# Patient Record
Sex: Female | Born: 1980 | Race: White | Hispanic: Yes | Marital: Married | Smoking: Never smoker
Health system: Southern US, Community
[De-identification: ages and names within clinical notes are randomized; demographics above are authoritative.]

## PROBLEM LIST (undated history)

## (undated) DIAGNOSIS — I10 Essential (primary) hypertension: Secondary | ICD-10-CM

---

## 2001-10-16 ENCOUNTER — Ambulatory Visit (HOSPITAL_COMMUNITY): Admission: RE | Admit: 2001-10-16 | Discharge: 2001-10-16 | Payer: Self-pay | Admitting: *Deleted

## 2001-12-26 ENCOUNTER — Encounter: Payer: Self-pay | Admitting: Obstetrics & Gynecology

## 2001-12-26 ENCOUNTER — Ambulatory Visit (HOSPITAL_COMMUNITY): Admission: RE | Admit: 2001-12-26 | Discharge: 2001-12-26 | Payer: Self-pay | Admitting: Obstetrics & Gynecology

## 2002-01-27 ENCOUNTER — Inpatient Hospital Stay (HOSPITAL_COMMUNITY): Admission: AD | Admit: 2002-01-27 | Discharge: 2002-01-29 | Payer: Self-pay | Admitting: *Deleted

## 2003-02-25 ENCOUNTER — Emergency Department (HOSPITAL_COMMUNITY): Admission: EM | Admit: 2003-02-25 | Discharge: 2003-02-25 | Payer: Self-pay | Admitting: Emergency Medicine

## 2003-02-25 ENCOUNTER — Encounter: Payer: Self-pay | Admitting: Emergency Medicine

## 2003-07-04 ENCOUNTER — Inpatient Hospital Stay (HOSPITAL_COMMUNITY): Admission: AD | Admit: 2003-07-04 | Discharge: 2003-07-04 | Payer: Self-pay | Admitting: *Deleted

## 2003-07-04 ENCOUNTER — Encounter: Payer: Self-pay | Admitting: *Deleted

## 2003-10-21 ENCOUNTER — Inpatient Hospital Stay (HOSPITAL_COMMUNITY): Admission: AD | Admit: 2003-10-21 | Discharge: 2003-10-21 | Payer: Self-pay | Admitting: *Deleted

## 2003-11-29 ENCOUNTER — Inpatient Hospital Stay (HOSPITAL_COMMUNITY): Admission: AD | Admit: 2003-11-29 | Discharge: 2003-12-01 | Payer: Self-pay | Admitting: Obstetrics and Gynecology

## 2004-08-20 ENCOUNTER — Emergency Department (HOSPITAL_COMMUNITY): Admission: EM | Admit: 2004-08-20 | Discharge: 2004-08-20 | Payer: Self-pay | Admitting: Family Medicine

## 2005-03-18 ENCOUNTER — Emergency Department (HOSPITAL_COMMUNITY): Admission: EM | Admit: 2005-03-18 | Discharge: 2005-03-18 | Payer: Self-pay | Admitting: Family Medicine

## 2005-10-26 ENCOUNTER — Emergency Department (HOSPITAL_COMMUNITY): Admission: EM | Admit: 2005-10-26 | Discharge: 2005-10-26 | Payer: Self-pay | Admitting: Emergency Medicine

## 2005-12-13 ENCOUNTER — Emergency Department (HOSPITAL_COMMUNITY): Admission: EM | Admit: 2005-12-13 | Discharge: 2005-12-13 | Payer: Self-pay | Admitting: Emergency Medicine

## 2007-08-06 ENCOUNTER — Inpatient Hospital Stay (HOSPITAL_COMMUNITY): Admission: AD | Admit: 2007-08-06 | Discharge: 2007-08-08 | Payer: Self-pay | Admitting: Family Medicine

## 2007-08-06 ENCOUNTER — Ambulatory Visit: Payer: Self-pay | Admitting: *Deleted

## 2008-07-07 ENCOUNTER — Ambulatory Visit: Payer: Self-pay | Admitting: *Deleted

## 2008-07-07 ENCOUNTER — Ambulatory Visit: Payer: Self-pay | Admitting: Family Medicine

## 2009-10-22 ENCOUNTER — Emergency Department (HOSPITAL_COMMUNITY): Admission: EM | Admit: 2009-10-22 | Discharge: 2009-10-22 | Payer: Self-pay | Admitting: Emergency Medicine

## 2011-06-16 ENCOUNTER — Ambulatory Visit (INDEPENDENT_AMBULATORY_CARE_PROVIDER_SITE_OTHER): Payer: Self-pay

## 2011-06-16 ENCOUNTER — Inpatient Hospital Stay (INDEPENDENT_AMBULATORY_CARE_PROVIDER_SITE_OTHER)
Admission: RE | Admit: 2011-06-16 | Discharge: 2011-06-16 | Disposition: A | Discharge status: Home / Self Care | Payer: Self-pay | Source: Ambulatory Visit | Attending: Family Medicine | Admitting: Family Medicine

## 2011-06-16 DIAGNOSIS — S92909A Unspecified fracture of unspecified foot, initial encounter for closed fracture: Secondary | ICD-10-CM

## 2011-09-16 LAB — DIFFERENTIAL
Lymphs Abs: 2.2
Monocytes Relative: 5
Neutro Abs: 6.7
Neutrophils Relative %: 70

## 2011-09-16 LAB — HIV ANTIBODY (ROUTINE TESTING W REFLEX): HIV: NONREACTIVE

## 2011-09-16 LAB — CBC
MCHC: 34
RBC: 4.6
RDW: 15.2 — ABNORMAL HIGH

## 2011-09-16 LAB — SICKLE CELL SCREEN: Sickle Cell Screen: NEGATIVE

## 2011-09-16 LAB — RAPID HIV SCREEN (WH-MAU): Rapid HIV Screen: NONREACTIVE

## 2011-09-16 LAB — RUBELLA SCREEN: Rubella: >500 — ABNORMAL HIGH

## 2011-09-16 LAB — ABO/RH: ABO/RH(D): O POS

## 2011-09-16 LAB — RPR: RPR Ser Ql: NONREACTIVE

## 2012-01-06 ENCOUNTER — Emergency Department (HOSPITAL_COMMUNITY)
Admission: EM | Admit: 2012-01-06 | Discharge: 2012-01-06 | Disposition: A | Discharge status: Home / Self Care | Payer: Self-pay | Attending: Emergency Medicine | Admitting: Emergency Medicine

## 2012-01-06 ENCOUNTER — Encounter (HOSPITAL_COMMUNITY): Payer: Self-pay | Admitting: Emergency Medicine

## 2012-01-06 DIAGNOSIS — R1310 Dysphagia, unspecified: Secondary | ICD-10-CM | POA: Insufficient documentation

## 2012-01-06 DIAGNOSIS — R51 Headache: Secondary | ICD-10-CM | POA: Insufficient documentation

## 2012-01-06 DIAGNOSIS — R070 Pain in throat: Secondary | ICD-10-CM | POA: Insufficient documentation

## 2012-01-06 DIAGNOSIS — R059 Cough, unspecified: Secondary | ICD-10-CM | POA: Insufficient documentation

## 2012-01-06 DIAGNOSIS — R05 Cough: Secondary | ICD-10-CM | POA: Insufficient documentation

## 2012-01-06 DIAGNOSIS — J3489 Other specified disorders of nose and nasal sinuses: Secondary | ICD-10-CM | POA: Insufficient documentation

## 2012-01-06 DIAGNOSIS — J069 Acute upper respiratory infection, unspecified: Secondary | ICD-10-CM | POA: Insufficient documentation

## 2012-01-06 MED ORDER — IBUPROFEN 600 MG PO TABS: 600.0000 mg | ORAL_TABLET | Freq: Four times a day (QID) | ORAL | Status: AC | PRN

## 2012-01-06 MED ORDER — AZITHROMYCIN 250 MG PO TABS: 250.0000 mg | ORAL_TABLET | Freq: Every day | ORAL | Status: AC

## 2012-01-06 MED ORDER — ACETAMINOPHEN 325 MG PO TABS
650.0000 mg | ORAL_TABLET | Freq: Once | ORAL | Status: AC
  Administered 2012-01-06: 650 mg via ORAL
  Filled 2012-01-06: qty 2

## 2012-01-06 NOTE — ED Notes (Signed)
Vital signs had already been done at 18:01.

## 2012-01-06 NOTE — ED Notes (Signed)
Pt c/o headache, pain in face, eyes watering and sore throat, onset 3 days ago.

## 2012-01-06 NOTE — ED Provider Notes (Signed)
History     CSN: [id number]  Arrival date & time 01/06/12  1632   First MD Initiated Contact with Patient 01/06/12 1727      Chief Complaint  Patient presents with  . Headache    (Consider location/radiation/quality/duration/timing/severity/associated sxs/prior treatment) Patient is a 31 y.o. female presenting with URI. The history is provided by the patient.  URI The primary symptoms include headaches, sore throat and cough. Primary symptoms do not include fever, fatigue, ear pain, swollen glands, wheezing, abdominal pain, nausea, vomiting, myalgias, arthralgias or rash. Primary symptoms comment: watering eyes The current episode started 3 to 5 days ago. This is a new problem. The problem has not changed since onset. The headache is not associated with photophobia, eye pain, neck stiffness or weakness.  The sore throat is not accompanied by trouble swallowing.  Symptoms associated with the illness include facial pain, sinus pressure, congestion and rhinorrhea. The illness is not associated with chills.    History reviewed. No pertinent past medical history.  History reviewed. No pertinent past surgical history.  No family history on file.  History  Substance Use Topics  . Smoking status: Never Smoker   . Smokeless tobacco: Not on file  . Alcohol Use: No    OB History    Grav Para Term Preterm Abortions TAB SAB Ect Mult Living                  Review of Systems  Constitutional: Negative for fever, chills, activity change, appetite change and fatigue.  HENT: Positive for congestion, sore throat, rhinorrhea and sinus pressure. Negative for ear pain, facial swelling, trouble swallowing, neck pain and neck stiffness.   Eyes: Positive for discharge. Negative for photophobia, pain, redness and visual disturbance.  Respiratory: Positive for cough. Negative for chest tightness, shortness of breath and wheezing.   Cardiovascular: Negative for chest pain and palpitations.    Gastrointestinal: Negative for nausea, vomiting and abdominal pain.  Genitourinary: Negative for dysuria.  Musculoskeletal: Negative for myalgias and arthralgias.  Skin: Negative for rash.  Neurological: Positive for headaches. Negative for dizziness and weakness.  Psychiatric/Behavioral: Negative.     Allergies  Review of patient's allergies indicates no known allergies.  Home Medications   Current Outpatient Rx  Name Route Sig Dispense Refill  . PSEUDOEPH-DOXYLAMINE-DM-APAP 60-7.05-03-999 MG/30ML PO LIQD Oral Take 30 mLs by mouth every 6 (six) hours as needed. For cold symptoms      BP 118/85  Pulse 102  Temp(Src) 97.9 F (36.6 C) (Oral)  Resp 18  SpO2 99%  LMP 01/02/2012  Physical Exam  Nursing note and vitals reviewed. Constitutional: She is oriented to person, place, and time. She appears well-developed and well-nourished. No distress.  HENT:  Head: Normocephalic and atraumatic.  Right Ear: External ear normal.  Left Ear: External ear normal.  Nose: Right sinus exhibits maxillary sinus tenderness. Right sinus exhibits no frontal sinus tenderness. Left sinus exhibits maxillary sinus tenderness. Left sinus exhibits no frontal sinus tenderness.  Mouth/Throat: Oropharynx is clear and moist. No oropharyngeal exudate.  Eyes: Conjunctivae are normal. Pupils are equal, round, and reactive to light.  Neck: Normal range of motion. Neck supple.  Cardiovascular: Normal rate, regular rhythm and normal heart sounds.  Exam reveals no gallop and no friction rub.   No murmur heard. Pulmonary/Chest: Effort normal and breath sounds normal. No respiratory distress. She has no wheezes. She has no rales. She exhibits no tenderness.  Abdominal: Soft. Bowel sounds are normal. There is no tenderness.  There is no rebound and no guarding.  Musculoskeletal: Normal range of motion. She exhibits no edema.  Lymphadenopathy:    She has no cervical adenopathy.  Neurological: She is alert and  oriented to person, place, and time. No cranial nerve deficit. Coordination normal.  Skin: Skin is warm and dry. No rash noted. She is not diaphoretic.  Psychiatric: She has a normal mood and affect.    ED Course  Procedures (including critical care time)  Labs Reviewed - No data to display No results found.   1. URI (upper respiratory infection)   2. Headache       MDM  Pt with 3 d of sinus tenderness, sore throat, cough, HA. Suspect HA may be sinus related. Nontox appearing, VSS. Abx rx given. Return precautions discussed.        [place], [place] 01/07/12 [phone]

## 2012-01-06 NOTE — ED Notes (Signed)
MD at bedside. 

## 2012-01-07 NOTE — ED Provider Notes (Signed)
Medical screening examination/treatment/procedure(s) were performed by non-physician practitioner and as supervising physician I was immediately available for consultation/collaboration. Devoria Albe, MD, FACEP   Ward Givens, MD 01/07/12 [phone]

## 2012-06-01 ENCOUNTER — Emergency Department (HOSPITAL_COMMUNITY): Payer: Self-pay

## 2012-06-01 ENCOUNTER — Encounter (HOSPITAL_COMMUNITY): Payer: Self-pay | Admitting: Emergency Medicine

## 2012-06-01 ENCOUNTER — Emergency Department (HOSPITAL_COMMUNITY)
Admission: EM | Admit: 2012-06-01 | Discharge: 2012-06-01 | Disposition: A | Discharge status: Home / Self Care | Payer: Self-pay | Attending: Emergency Medicine | Admitting: Emergency Medicine

## 2012-06-01 DIAGNOSIS — M79609 Pain in unspecified limb: Secondary | ICD-10-CM | POA: Insufficient documentation

## 2012-06-01 DIAGNOSIS — M79672 Pain in left foot: Secondary | ICD-10-CM

## 2012-06-01 MED ORDER — TRAMADOL HCL 50 MG PO TABS: 50.0000 mg | ORAL_TABLET | Freq: Four times a day (QID) | ORAL | Status: AC | PRN

## 2012-06-01 MED ORDER — NAPROXEN 500 MG PO TABS: 500.0000 mg | ORAL_TABLET | Freq: Two times a day (BID) | ORAL | Status: DC

## 2012-06-01 NOTE — Discharge Instructions (Signed)
I suspect you may have some tendonitis in your foot. Keep it wrapped for swelling. Elevate. Ice several times a day. Avoid extensive physical activity. Naprosyn for pain. Ultram for severe pain as needed. Follow up with orthopedics.   Tendinitis (Tendinitis) La tendinitis es la hinchazn e irritacin (inflamacin) de los tejidos denominados tendones. Los tendones son estructuras similares a cuerdas que [employer] al [employer]. Ocurre con ms frecuencia en:   Los hombros (manguito rotador).   Talones (tendn de [place]).   Codos (tendn del trceps).  CAUSAS La tendinitis generalmente es causada por el uso excesivo del tendn, los [place], y las Lobbyist. Cuando el tejido que rodea al tendn (la sinovia) se inflama, se denomina tenosinovitis. La tendinitis se presenta comnmente en personas cuyos trabajos requieren movimientos repetitivos. SNTOMAS   Dolor.   Sensibilidad.   Inflamacin leve.  DIAGNSTICO La tendinitis normalmente se diagnostica con un examen fsico. El mdico tambin podr solicitar radiografas u otros exmenes imagenolgicos.  TRATAMIENTO El mdico podr recomendar determinados medicamentos o ejercicios para Scientist, research (medical).  INSTRUCCIONES PARA EL CUIDADO DOMICILIARIO  Utilice un cabestrillo o una tablilla durante el tiempo que se lo indique su mdico hasta que el dolor disminuya.   Aplique hielo sobre la zona lesionada.   Ponga el hielo en una bolsa plstica.   Colquese una toalla entre la piel y la bolsa de hielo.   Deje el hielo durante 15 a 20 minutos, 3 a 4 veces por da.   Evite utilizar la extremidad [employer] en el tendn. Haga ejercicios suaves con amplitud de movimientos, segn las indicaciones del profesional. Suspenda los ejercicios si el dolor o las molestias [place], a menos que el profesional le indique otra cosa.   Utilice los medicamentos de venta libre o de prescripcin para Chief Technology Officer, Environmental health practitioner o la [place],  segn se lo indique el profesional que lo asiste.  SOLICITE ATENCIN MDICA SI:  El dolor y la hinchazn [place].   Desarrolla sntomas nuevos y sin causa aparente, especialmente adormecimiento de las manos.  EST SEGURO QUE:   Comprende las instrucciones para el alta mdica.   Controlar su enfermedad.   Solicitar atencin mdica de inmediato segn las indicaciones.  Document Released: 08/31/2005 Document Revised: 11/10/2011 [facility] Patient Information 2012 [place], [place].

## 2012-06-01 NOTE — ED Notes (Signed)
Son/translator stated, she here because her leg still hurts. Pt. Pointer to the top of her foot up her leg. Xrays were performed on July 12, 13

## 2012-06-01 NOTE — ED Provider Notes (Signed)
History     CSN: [id number]  Arrival date & time 06/01/12  4098   First MD Initiated Contact with Patient 06/01/12 [phone]      Chief Complaint  Patient presents with  . Leg Pain    (Consider location/radiation/quality/duration/timing/severity/associated sxs/prior treatment) HPI Comments: Interpreter phones used. Pt is a 31yo female who presents with pain to the left foot. States she fell off the table and injured her left foot a year ago. Was seen and had x-rays done at that time which showed fracture of 5th metatarsal. Pt states she went to an orthopedist for a follow up and they told her that no surgery is needed for her injury. Pt states he foot continues to hurt. She denies any more injuries. No swelling, fever, chills, malaise. No numbness or weakness. Pain worsened with movement and walking.    History reviewed. No pertinent past medical history.  History reviewed. No pertinent past surgical history.  No family history on file.  History  Substance Use Topics  . Smoking status: Never Smoker   . Smokeless tobacco: Not on file  . Alcohol Use: No    OB History    Grav Para Term Preterm Abortions TAB SAB Ect Mult Living                  Review of Systems  Constitutional: Negative for fever and chills.  Respiratory: Negative.   Cardiovascular: Negative.   Musculoskeletal:       Positive for an ankle pain  Skin: Negative.   Neurological: Negative for dizziness, weakness and numbness.    Allergies  Review of patient's allergies indicates no known allergies.  Home Medications  No current outpatient prescriptions on file.  BP 108/80  Pulse 81  Temp 98.6 F (37 C) (Oral)  Resp 16  SpO2 98%  LMP 06/01/2012  Physical Exam  Nursing note and vitals reviewed. Constitutional: She appears well-developed and well-nourished. No distress.  HENT:  Head: Normocephalic.  Eyes: Conjunctivae are normal.  Neck: Neck supple.  Cardiovascular: Normal rate, regular rhythm and  normal heart sounds.   Pulmonary/Chest: Effort normal and breath sounds normal. No respiratory distress. She has no wheezes. She has no rales.  Musculoskeletal:       Normal appearing left ankle and foot with no swelling. Tender to palpation just below lateral malleolus and over 5th metatarsal. Also tender over 1st and 2nd metatarsals. Pain with plantarflexion and inversion of the foot. Joint stable, negative drawer sign. Normal dorsal pedal pulse. No pain over knee. No pain with movement of any of the toes.    Neurological: She is alert.  Skin: Skin is warm and dry.  Psychiatric: She has a normal mood and affect.    ED Course  Procedures (including critical care time)  Pt with left foot pain after an injury a year ago. States pain not improving. Will repeat x-ray.   Dg Foot Complete Left  06/01/2012  *RADIOLOGY REPORT*  Clinical Data: Fall, pain.  LEFT FOOT - COMPLETE 3+ VIEW  Comparison: 06/16/2011  Findings: Old healed fracture at the base of the left fifth metatarsal.  No acute findings.  No acute fracture, subluxation or dislocation.  Joint spaces are maintained.  Normal bone mineralization.  Soft tissues are unremarkable.  IMPRESSION: Old healed proximal left fifth metatarsal fracture.  No acute findings.  Original Report Authenticated By: Cyndie Chime, M.D.   X-ray negative. i suspect pt has some tendonitis. She has no signs of infection, joints are  stable, she is ambulatory. WIll refer back to orthopedics.    1. Pain in left foot       MDM          Lottie Mussel, PA 06/01/12 1633

## 2012-06-03 NOTE — ED Provider Notes (Signed)
Medical screening examination/treatment/procedure(s) were performed by non-physician practitioner and as supervising physician I was immediately available for consultation/collaboration.  Jaquavis Felmlee K Meta Kroenke-Rasch, MD 06/03/12 2329 

## 2012-08-22 ENCOUNTER — Emergency Department (HOSPITAL_COMMUNITY)
Admission: EM | Admit: 2012-08-22 | Discharge: 2012-08-22 | Disposition: A | Discharge status: Home / Self Care | Payer: Self-pay | Attending: Emergency Medicine | Admitting: Emergency Medicine

## 2012-08-22 ENCOUNTER — Emergency Department (HOSPITAL_COMMUNITY): Payer: Self-pay

## 2012-08-22 ENCOUNTER — Encounter (HOSPITAL_COMMUNITY): Payer: Self-pay | Admitting: Physical Medicine and Rehabilitation

## 2012-08-22 DIAGNOSIS — R071 Chest pain on breathing: Secondary | ICD-10-CM | POA: Insufficient documentation

## 2012-08-22 DIAGNOSIS — R079 Chest pain, unspecified: Secondary | ICD-10-CM

## 2012-08-22 LAB — CBC WITH DIFFERENTIAL/PLATELET
Basophils Absolute: 0 10*3/uL (ref 0.0–0.1)
Basophils Relative: 0 % (ref 0–1)
Eosinophils Absolute: 0.2 10*3/uL (ref 0.0–0.7)
HCT: 39.2 % (ref 36.0–46.0)
MCH: 27.2 pg (ref 26.0–34.0)
MCHC: 32.9 g/dL (ref 30.0–36.0)
Monocytes Absolute: 0.5 10*3/uL (ref 0.1–1.0)
Neutro Abs: 5.3 10*3/uL (ref 1.7–7.7)
RDW: 14.9 % (ref 11.5–15.5)

## 2012-08-22 LAB — COMPREHENSIVE METABOLIC PANEL
AST: 25 U/L (ref 0–37)
Albumin: 3.8 g/dL (ref 3.5–5.2)
BUN: 8 mg/dL (ref 6–23)
Calcium: 9.4 mg/dL (ref 8.4–10.5)
Chloride: 104 mEq/L (ref 96–112)
Creatinine, Ser: 0.57 mg/dL (ref 0.50–1.10)
Total Bilirubin: 0.2 mg/dL — ABNORMAL LOW (ref 0.3–1.2)
Total Protein: 7.2 g/dL (ref 6.0–8.3)

## 2012-08-22 LAB — LIPASE, BLOOD: Lipase: 38 U/L (ref 11–59)

## 2012-08-22 LAB — D-DIMER, QUANTITATIVE: D-Dimer, Quant: <0.27 ug/mL-FEU (ref 0.00–0.48)

## 2012-08-22 MED ORDER — FAMOTIDINE 20 MG PO TABS: 20.0000 mg | ORAL_TABLET | Freq: Two times a day (BID) | ORAL | Status: DC

## 2012-08-22 MED ORDER — SODIUM CHLORIDE 0.9 % IV BOLUS (SEPSIS)
1000.0000 mL | Freq: Once | INTRAVENOUS | Status: AC
  Administered 2012-08-22: 1000 mL via INTRAVENOUS

## 2012-08-22 MED ORDER — FAMOTIDINE 20 MG PO TABS
20.0000 mg | ORAL_TABLET | Freq: Once | ORAL | Status: AC
  Administered 2012-08-22: 20 mg via ORAL
  Filled 2012-08-22: qty 1

## 2012-08-22 MED ORDER — GI COCKTAIL ~~LOC~~
30.0000 mL | Freq: Once | ORAL | Status: AC
  Administered 2012-08-22: 30 mL via ORAL
  Filled 2012-08-22: qty 30

## 2012-08-22 NOTE — ED Notes (Signed)
Pt reports sharp left sided chest pain that radiates to left back.  Pt reports this started 3am today with pain 7/10.  Pt alert oriented X4.  Denies injury Denies tobacco use.

## 2012-08-22 NOTE — ED Provider Notes (Signed)
History     CSN: [id number]  Arrival date & time 08/22/12  0857   First MD Initiated Contact with Patient 08/22/12 [phone]      Chief Complaint  Patient presents with  . Chest Pain     HPI The patient presents with concerns of chest pain.  She notes that she was in her usual state of health until approximately 6 hours ago.  She woke with pain it was in her sternal, with radiation to the left side into her back.  The pain is worse with supine positioning, but also worse with inspiration.  She denies exertional changes.  The pain is sharp.  No other clear alleviating or exacerbating factors. The patient denies lightheadedness, syncope, nausea, vomiting, diarrhea. The patient has no prior similar episodes. She denies medical history.  She states that she does not smoke or drink.  No recent travel. No past medical history on file.  No past surgical history on file.  No family history on file.  History  Substance Use Topics  . Smoking status: Never Smoker   . Smokeless tobacco: Not on file  . Alcohol Use: No    OB History    Grav Para Term Preterm Abortions TAB SAB Ect Mult Living                  Review of Systems  Constitutional:       HPI  HENT:       HPI otherwise negative  Eyes: Negative.   Respiratory:       HPI, otherwise negative  Cardiovascular:       HPI, otherwise nmegative  Gastrointestinal: Negative for vomiting.  Genitourinary:       HPI, otherwise negative  Musculoskeletal:       HPI, otherwise negative  Skin: Negative.   Neurological: Negative for syncope.    Allergies  Review of patient's allergies indicates no known allergies.  Home Medications  No current outpatient prescriptions on file.  BP 115/80  Pulse 82  Temp 98.1 F (36.7 C) (Oral)  Resp 16  SpO2 98%  Physical Exam  Nursing note and vitals reviewed. Constitutional: She is oriented to person, place, and time. She appears well-developed and well-nourished. No distress.  HENT:    Head: Normocephalic and atraumatic.  Eyes: Conjunctivae normal and EOM are normal.  Cardiovascular: Normal rate and regular rhythm.   Pulmonary/Chest: Effort normal and breath sounds normal. No stridor. No respiratory distress.  Abdominal: She exhibits no distension.  Musculoskeletal: She exhibits no edema.  Neurological: She is alert and oriented to person, place, and time. No cranial nerve deficit.  Skin: Skin is warm and dry.  Psychiatric: She has a normal mood and affect.    ED Course  Procedures (including critical care time)   Labs Reviewed  CBC WITH DIFFERENTIAL  COMPREHENSIVE METABOLIC PANEL  LIPASE, BLOOD   No results found.   No diagnosis found.  Cardiac 95 sinus rhythm normal Pulse ox 99% room air normal   Date: 08/22/2012  Rate: 96  Rhythm: normal sinus rhythm  QRS Axis: normal  Intervals: normal  ST/T Wave abnormalities: nonspecific T wave changes  Conduction Disutrbances:none  Narrative Interpretation:   Old EKG Reviewed: none available ABNORMAL      MDM  This generally well-appearing 31 year old female presents with chest pain.  Given the patient's description of left sided chest pain that radiates to her back, with pleuritic component there is some suspicion thromboembolic process, though absent risk factors this  seems less likely.  The patient's initial ECG had nonspecific T wave changes, and given her description of pain, a d-dimer was sent.  This, and other lab studies were unremarkable.  Patient had significant improvement with GI cocktail, Pepcid, was discharged in stable condition with PMD followup      Gerhard Munch, MD 08/22/12 1108

## 2012-08-22 NOTE — ED Notes (Signed)
Pt back to room from being out of the department; pt placed back on monitor, continuous pulse oximetry and blood pressure cuff

## 2012-08-22 NOTE — ED Notes (Addendum)
Pt presents to department for evaluation of L sided chest pain radiating to back. Onset 03:00 this morning. 7/10 pain at the time, increases with deep breathing. Skin warm and dry. Respirations unlabored. She is conscious alert and oriented x4.

## 2013-02-03 IMAGING — CR DG FOOT COMPLETE 3+V*L*
3 series · 3 of 3 positions shown · non-contrast
Comparison: 06/16/2011

CLINICAL DATA: Fall, pain.

LEFT FOOT - COMPLETE 3+ VIEW

[t foot ap left]
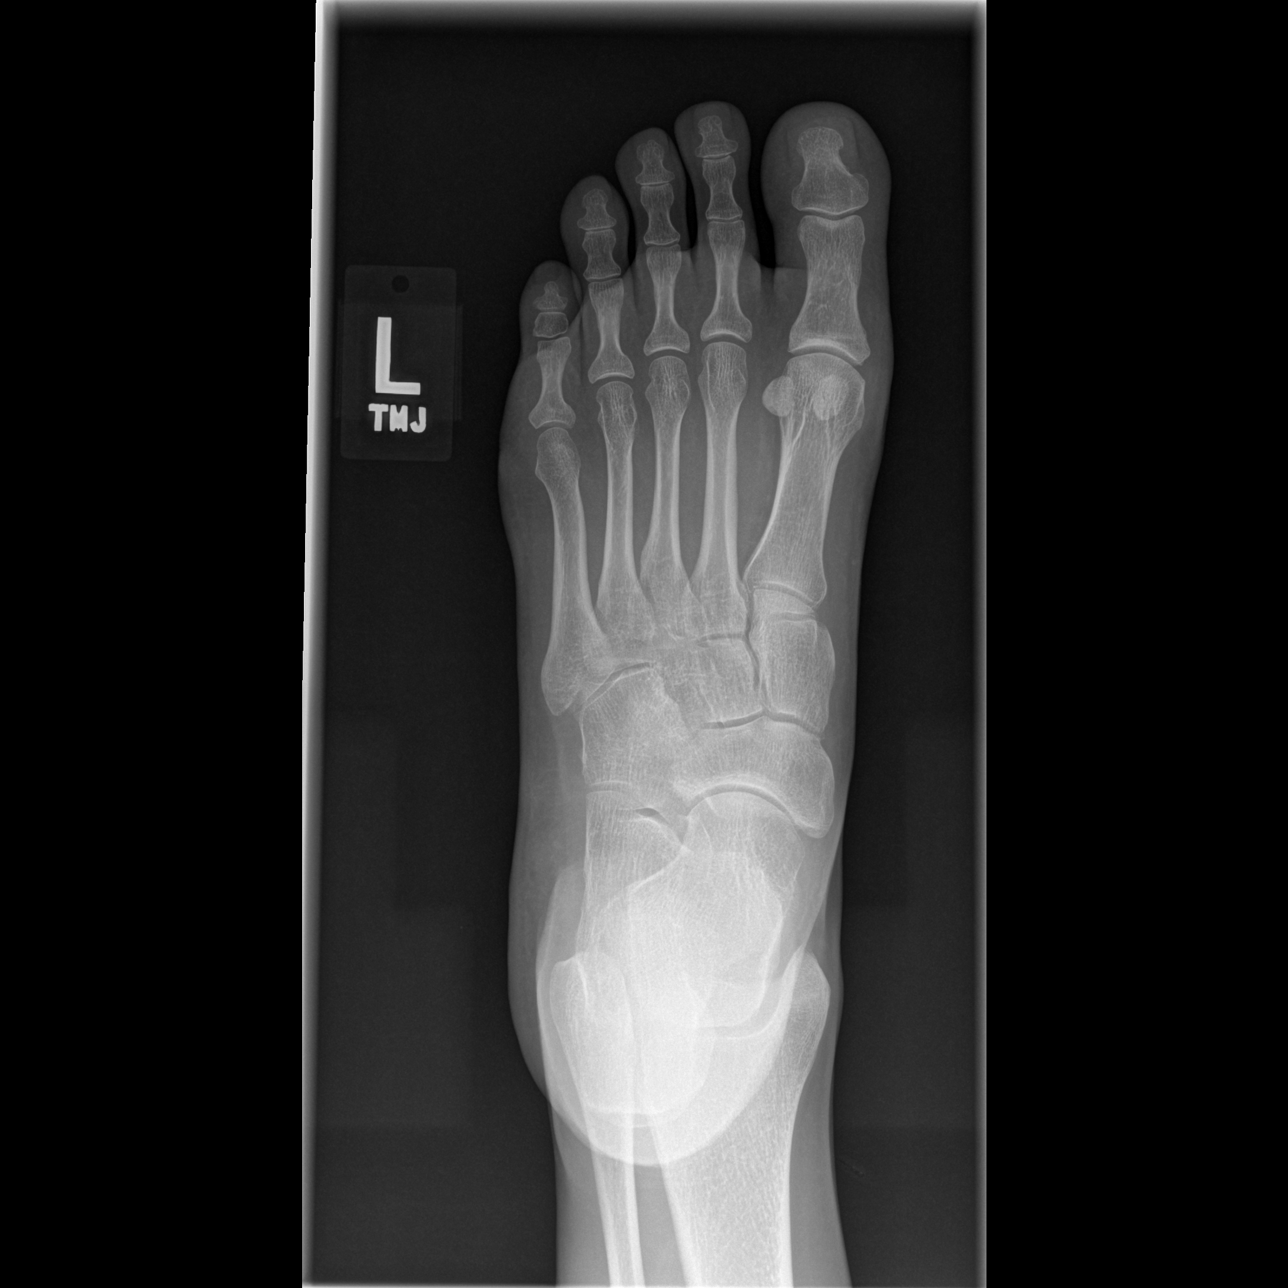

[t foot oblique left]
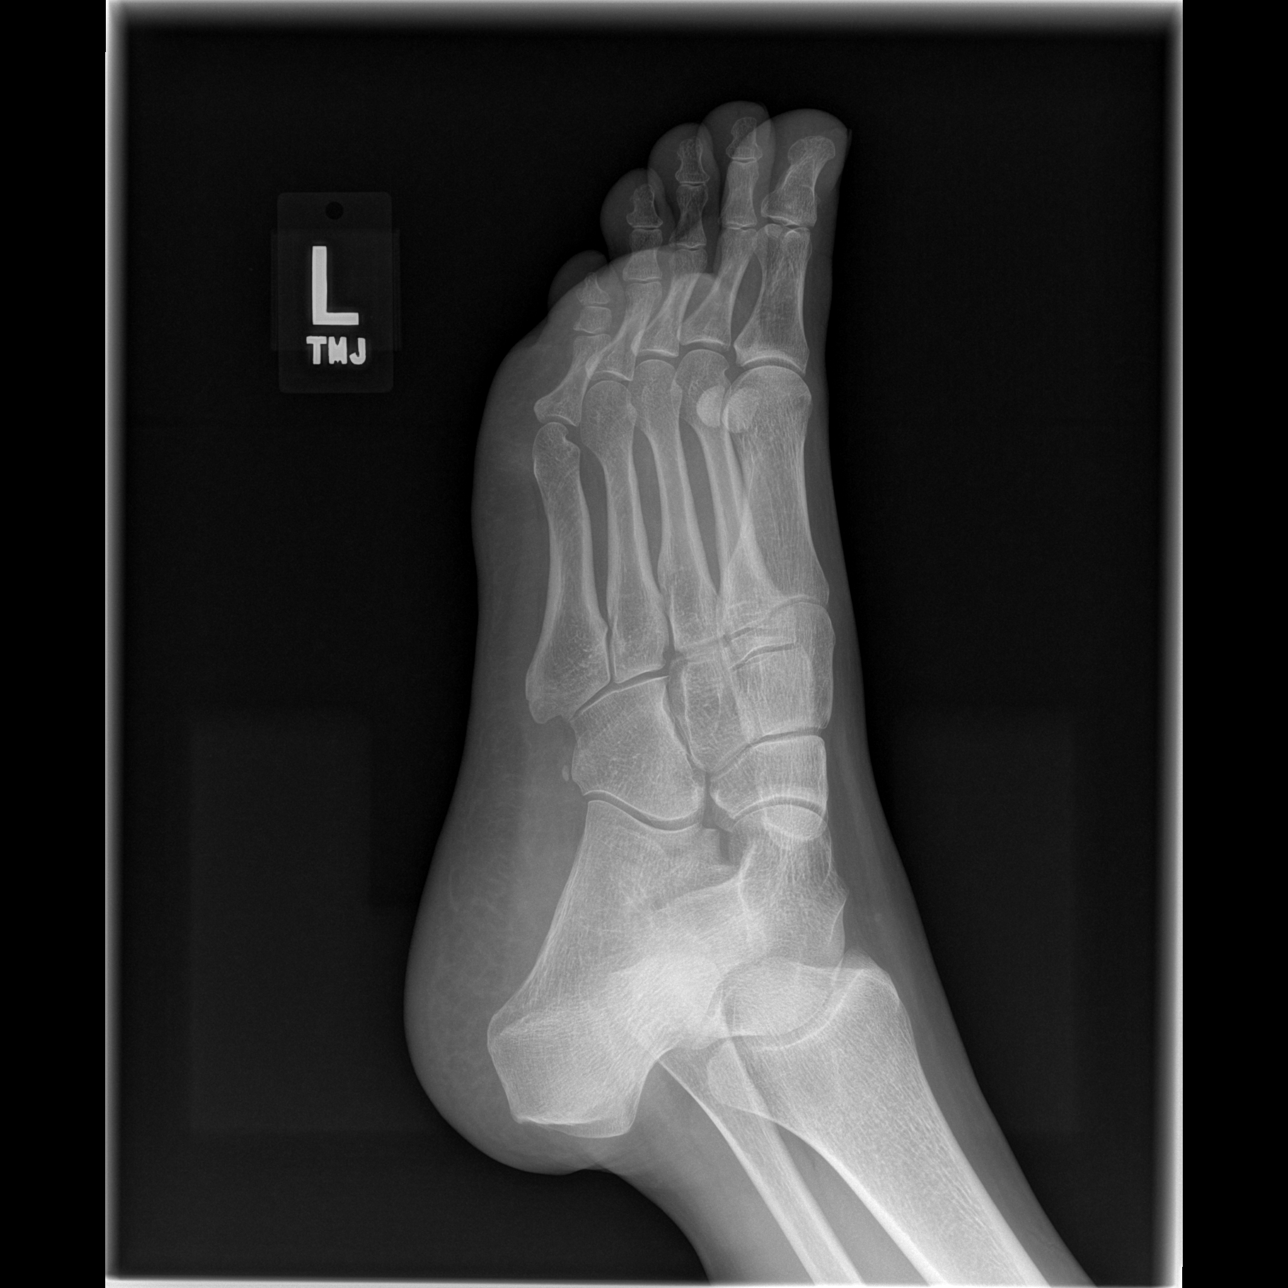

[t foot lat left]
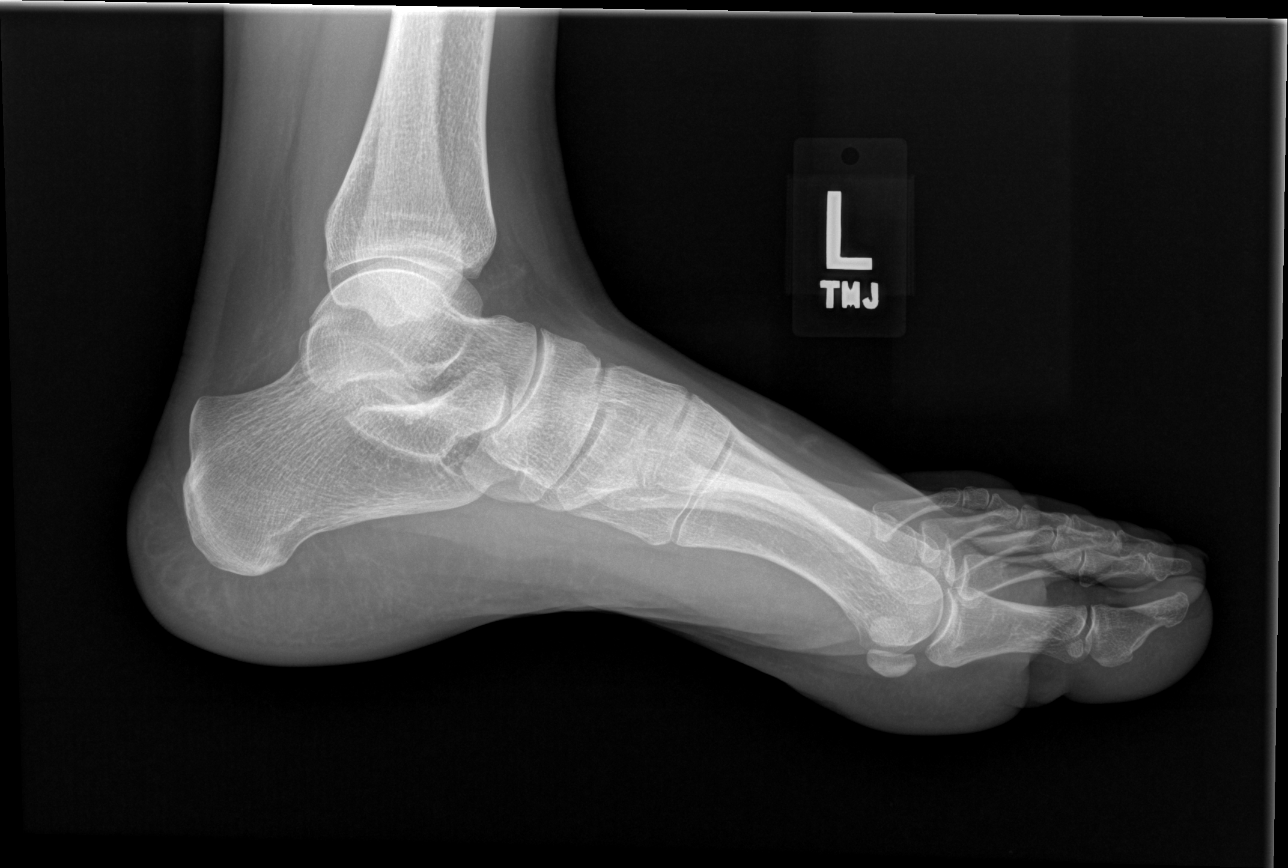

[3 of 3 positions shown; findings below may reference images not displayed]

FINDINGS: Old healed fracture at the base of the left fifth
metatarsal.  No acute findings.  No acute fracture, subluxation or
dislocation.  Joint spaces are maintained.  Normal bone
mineralization.  Soft tissues are unremarkable.
IMPRESSION: Old healed proximal left fifth metatarsal fracture.  No acute
findings.

## 2013-04-18 ENCOUNTER — Encounter (HOSPITAL_COMMUNITY): Payer: Self-pay | Admitting: Cardiology

## 2013-04-18 ENCOUNTER — Emergency Department (HOSPITAL_COMMUNITY)
Admission: EM | Admit: 2013-04-18 | Discharge: 2013-04-18 | Disposition: A | Discharge status: Home / Self Care | Payer: No Typology Code available for payment source | Attending: Emergency Medicine | Admitting: Emergency Medicine

## 2013-04-18 ENCOUNTER — Emergency Department (HOSPITAL_COMMUNITY): Payer: No Typology Code available for payment source

## 2013-04-18 DIAGNOSIS — IMO0002 Reserved for concepts with insufficient information to code with codable children: Secondary | ICD-10-CM | POA: Insufficient documentation

## 2013-04-18 DIAGNOSIS — Y92410 Unspecified street and highway as the place of occurrence of the external cause: Secondary | ICD-10-CM | POA: Insufficient documentation

## 2013-04-18 DIAGNOSIS — Y9389 Activity, other specified: Secondary | ICD-10-CM | POA: Insufficient documentation

## 2013-04-18 MED ORDER — HYDROCODONE-ACETAMINOPHEN 5-325 MG PO TABS
2.0000 | ORAL_TABLET | Freq: Once | ORAL | Status: AC
  Administered 2013-04-18: 2 via ORAL
  Filled 2013-04-18: qty 2

## 2013-04-18 MED ORDER — ONDANSETRON 4 MG PO TBDP
8.0000 mg | ORAL_TABLET | Freq: Once | ORAL | Status: AC
  Administered 2013-04-18: 8 mg via ORAL
  Filled 2013-04-18: qty 2

## 2013-04-18 MED ORDER — HYDROCODONE-ACETAMINOPHEN 5-325 MG PO TABS: 2.0000 | ORAL_TABLET | ORAL | Status: DC | PRN

## 2013-04-18 MED ORDER — PROMETHAZINE HCL 25 MG PO TABS: 25.0000 mg | ORAL_TABLET | Freq: Four times a day (QID) | ORAL | Status: DC | PRN

## 2013-04-18 MED ORDER — METHOCARBAMOL 500 MG PO TABS: 500.0000 mg | ORAL_TABLET | Freq: Two times a day (BID) | ORAL | Status: DC
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
## 2013-04-18 NOTE — ED Notes (Signed)
Pt discharged.Vital signs stable and GCS 15 
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
## 2013-04-20 NOTE — ED Provider Notes (Signed)
Medical screening examination/treatment/procedure(s) were performed by non-physician practitioner and as supervising physician I was immediately available for consultation/collaboration.  Vinicio Lynk, MD 04/20/13 0717 

## 2014-11-04 ENCOUNTER — Encounter (HOSPITAL_COMMUNITY): Payer: Self-pay | Admitting: Family Medicine

## 2014-11-04 ENCOUNTER — Emergency Department (INDEPENDENT_AMBULATORY_CARE_PROVIDER_SITE_OTHER)
Admission: EM | Admit: 2014-11-04 | Discharge: 2014-11-04 | Disposition: A | Discharge status: Home / Self Care | Payer: Self-pay | Source: Home / Self Care | Attending: Family Medicine | Admitting: Family Medicine

## 2014-11-04 DIAGNOSIS — J069 Acute upper respiratory infection, unspecified: Secondary | ICD-10-CM

## 2014-11-04 DIAGNOSIS — B9789 Other viral agents as the cause of diseases classified elsewhere: Principal | ICD-10-CM

## 2014-11-04 LAB — POCT RAPID STREP A: Streptococcus, Group A Screen (Direct): NEGATIVE

## 2014-11-04 MED ORDER — ONDANSETRON HCL 4 MG PO TABS: 4.0000 mg | ORAL_TABLET | Freq: Three times a day (TID) | ORAL | Status: DC | PRN

## 2014-11-04 MED ORDER — IPRATROPIUM BROMIDE 0.06 % NA SOLN: 2.0000 | Freq: Four times a day (QID) | NASAL | Status: DC

## 2014-11-04 NOTE — ED Provider Notes (Addendum)
CSN: [id number]     Arrival date & time 11/04/14  1045 History   First MD Initiated Contact with Patient 11/04/14 1155     Chief Complaint  Patient presents with  . Cough  . Emesis   (Consider location/radiation/quality/duration/timing/severity/associated sxs/prior Treatment) HPI  5 days ago developed body aches. 3 days ago developed fever 102, vomiting, and coughing. Son w/ similar symptoms. Denies abd pain, dysuria, frequency, diarrhea. Sore throat and mild wheezing. Nyquil w/o benefit.    History reviewed. No pertinent past medical history. History reviewed. No pertinent past surgical history. No family history on file. History  Substance Use Topics  . Smoking status: Never Smoker   . Smokeless tobacco: Not on file  . Alcohol Use: No   OB History    No data available     Review of Systems Per HPI with all other pertinent systems negative.   Allergies  Review of patient's allergies indicates no known allergies.  Home Medications   Prior to Admission medications   Medication Sig Start Date End Date Taking? Authorizing Provider  Pseudoeph-Doxylamine-DM-APAP (NYQUIL PO) Take by mouth.   Yes Historical Provider, MD  ipratropium (ATROVENT) 0.06 % nasal spray Place 2 sprays into both nostrils 4 (four) times daily. 11/04/14   Ozella Rocksavid J Merrell, MD  ondansetron (ZOFRAN) 4 MG tablet Take 1 tablet (4 mg total) by mouth every 8 (eight) hours as needed for nausea or vomiting. 11/04/14   Ozella Rocksavid J Merrell, MD   BP 116/69 mmHg  Pulse 111  Temp(Src) 99 F (37.2 C) (Oral)  Resp 16  SpO2 95%  LMP 11/01/2014 Physical Exam  Constitutional: She is oriented to person, place, and time. She appears well-developed and well-nourished. No distress.  HENT:  Sinuses nonttp TM nml bilat Pharyngeal cobblestoning.  tonsills 0-1+ w/o exudate  Eyes: EOM are normal. Pupils are equal, round, and reactive to light.  Cardiovascular: Normal rate and normal heart sounds.   Pulmonary/Chest: Effort normal  and breath sounds normal. She has no wheezes.  Abdominal: Soft. She exhibits no distension.  Musculoskeletal: Normal range of motion.  Lymphadenopathy:    She has no cervical adenopathy.  Neurological: She is oriented to person, place, and time.  Skin: Skin is warm and dry. No rash noted. She is not diaphoretic.  Psychiatric: She has a normal mood and affect. Her behavior is normal. Judgment and thought content normal.    ED Course  Procedures (including critical care time) Labs Review Labs Reviewed  POCT RAPID STREP A ([facility] CARE ONLY)    Imaging Review No results found.   MDM   1. Viral URI with cough    Start NSAIDs, fluids, nasal atrovent, rest Rapid strep negative.  Precautions given and all questions answered  Shelly Flattenavid Merrell, MD Family Medicine 11/04/2014, 12:40 PM      Ozella Rocksavid J Merrell, MD 11/04/14 1240  Ozella Rocksavid J Merrell, MD 11/04/14 [phone]

## 2014-11-04 NOTE — ED Notes (Signed)
Cough and vomiting since Friday 11/27

## 2014-11-04 NOTE — Discharge Instructions (Signed)
You likely have a viral infection .  This will take time to go away.  Please use the zofran for nausea Please use the atrovent for runny nose and cough Please take 400-600 mg of ibuprofen every 4-5 hours for pain and fever.  If you are not better in another 3-5 days or get significantly worse then come back.  Es posible que tenga una infeccin viral. Esto tomar Network engineertiempo en desaparecer. Utilice el zofran para las nuseas Utilice el atrovent de secrecin nasal y tos Por favor, tome 400 a 600 mg de ibuprofeno cada 4-5 horas para Chief Technology Officerel dolor y la fiebre. Si usted no es Airline pilotmejor en otros 3-5 das o conseguir significativamente Copypeor que volver.

## 2014-11-06 LAB — CULTURE, GROUP A STREP

## 2015-09-25 ENCOUNTER — Encounter (HOSPITAL_COMMUNITY): Payer: Self-pay | Admitting: Emergency Medicine

## 2015-09-25 ENCOUNTER — Emergency Department (HOSPITAL_COMMUNITY)
Admission: EM | Admit: 2015-09-25 | Discharge: 2015-09-25 | Disposition: A | Discharge status: Home / Self Care | Payer: Self-pay | Attending: Emergency Medicine | Admitting: Emergency Medicine

## 2015-09-25 DIAGNOSIS — J029 Acute pharyngitis, unspecified: Secondary | ICD-10-CM | POA: Insufficient documentation

## 2015-09-25 DIAGNOSIS — B9689 Other specified bacterial agents as the cause of diseases classified elsewhere: Secondary | ICD-10-CM

## 2015-09-25 DIAGNOSIS — J028 Acute pharyngitis due to other specified organisms: Secondary | ICD-10-CM

## 2015-09-25 DIAGNOSIS — A389 Scarlet fever, uncomplicated: Secondary | ICD-10-CM | POA: Insufficient documentation

## 2015-09-25 LAB — RAPID STREP SCREEN (MED CTR MEBANE ONLY): STREPTOCOCCUS, GROUP A SCREEN (DIRECT): NEGATIVE

## 2015-09-25 MED ORDER — DEXAMETHASONE SODIUM PHOSPHATE 10 MG/ML IJ SOLN
10.0000 mg | Freq: Once | INTRAMUSCULAR | Status: AC
  Administered 2015-09-25: 10 mg via INTRAMUSCULAR
  Filled 2015-09-25: qty 1

## 2015-09-25 MED ORDER — IBUPROFEN 800 MG PO TABS
800.0000 mg | ORAL_TABLET | Freq: Three times a day (TID) | ORAL | Status: DC
Start: 2015-09-25 — End: 2022-03-12

## 2015-09-25 MED ORDER — HYDROCORTISONE 1 % EX LOTN
TOPICAL_LOTION | Freq: Two times a day (BID) | CUTANEOUS | Status: DC
  Filled 2015-09-25: qty 118

## 2015-09-25 MED ORDER — PENICILLIN G BENZATHINE 1200000 UNIT/2ML IM SUSP
1.2000 10*6.[IU] | Freq: Once | INTRAMUSCULAR | Status: AC
  Administered 2015-09-25: 1.2 10*6.[IU] via INTRAMUSCULAR
  Filled 2015-09-25: qty 2

## 2015-09-25 MED ORDER — HYDROCORTISONE 1 % EX CREA
TOPICAL_CREAM | Freq: Two times a day (BID) | CUTANEOUS | Status: DC
  Administered 2015-09-25: 1 via TOPICAL
  Filled 2015-09-25: qty 28

## 2015-09-25 NOTE — ED Provider Notes (Signed)
CSN: [id number]     Arrival date & time 09/25/15  7829 History  By signing my name below, I, Amanda Spears, attest that this documentation has been prepared under the direction and in the presence of [facility], PA-C. Electronically Signed: Elon Spears ED Scribe. 09/25/2015. 12:16 PM.    Chief Complaint  Patient presents with  . Rash   The history is provided by the patient and medical records. No language interpreter was used.    HPI Comments: Amanda Spears is a 34 y.o. female with no significant hx who presents to the Emergency Department complaining a non-itching rash onset 3 days ago; relieved minimally by benadryl.  Associated symptoms include subjective fever, sore throat onset yesterday and headache.  She denies sick contacts. She denies new lotions, soaps, or detergents.  She denies nausea.  NKA.     History reviewed. No pertinent past medical history. History reviewed. No pertinent past surgical history. No family history on file. Social History  Substance Use Topics  . Smoking status: Never Smoker   . Smokeless tobacco: None  . Alcohol Use: No   OB History    No data available     Review of Systems  Constitutional: Positive for fever.  HENT: Positive for sore throat.   Respiratory: Negative for cough, shortness of breath and stridor.   Cardiovascular: Negative for chest pain.  Gastrointestinal: Negative for nausea and vomiting.  Musculoskeletal: Negative for back pain.  Skin: Positive for rash.  Neurological: Positive for headaches.      Allergies  Review of patient's allergies indicates no known allergies.  Home Medications   Prior to Admission medications   Medication Sig Start Date End Date Taking? Authorizing Provider  ibuprofen (ADVIL,MOTRIN) 800 MG tablet Take 1 tablet (800 mg total) by mouth 3 (three) times daily. 09/25/15   Aydeen Blume, PA-C  ipratropium (ATROVENT) 0.06 % nasal spray Place 2 sprays into both nostrils 4 (four)  times daily. 11/04/14   Ozella Rocks, MD  ondansetron (ZOFRAN) 4 MG tablet Take 1 tablet (4 mg total) by mouth every 8 (eight) hours as needed for nausea or vomiting. 11/04/14   Ozella Rocks, MD  Pseudoeph-Doxylamine-DM-APAP (NYQUIL PO) Take by mouth.    Historical Provider, MD   BP 127/69 mmHg  Pulse 89  Temp(Src) 98.4 F (36.9 C) (Oral)  Resp 18  Ht  (1.626 m)  Wt 148 lb (67.132 kg)  BMI 25.39 kg/m2  SpO2 98%  LMP 09/02/2015 Physical Exam  Constitutional: She appears well-developed and well-nourished. No distress.  HENT:  Head: Normocephalic and atraumatic.  Right Ear: Tympanic membrane, external ear and ear canal normal.  Left Ear: Tympanic membrane, external ear and ear canal normal.  Nose: Nose normal. No mucosal edema or rhinorrhea.  Mouth/Throat: Uvula is midline and mucous membranes are normal. Mucous membranes are not dry. No trismus in the jaw. No uvula swelling. Oropharyngeal exudate, posterior oropharyngeal edema and posterior oropharyngeal erythema present. No tonsillar abscesses.  Posterior oropharynx with erythema, edema and exudate on the tonsils  Eyes: Conjunctivae are normal.  Neck: Normal range of motion, full passive range of motion without pain and phonation normal. No tracheal tenderness, no spinous process tenderness and no muscular tenderness present. No rigidity. No erythema and normal range of motion present. No Brudzinski's sign and no Kernig's sign noted.  Range of motion without pain  No midline or paraspinal tenderness Normal phonation No stridor Handling secretions without difficulty No nuchal rigidity or meningeal signs  Cardiovascular: Normal rate, regular rhythm, normal heart sounds and intact distal pulses.   Pulses:      Radial pulses are 2+ on the right side, and 2+ on the left side.  Pulmonary/Chest: Effort normal and breath sounds normal. No stridor. No respiratory distress. She has no decreased breath sounds. She has no wheezes.   Equal chest expansion, clear and equal breath sounds without focal wheezes, rhonchi or rales  Musculoskeletal: Normal range of motion.  Lymphadenopathy:       Head (right side): Submandibular and tonsillar adenopathy present. No submental, no preauricular, no posterior auricular and no occipital adenopathy present.       Head (left side): Submandibular and tonsillar adenopathy present. No submental, no preauricular, no posterior auricular and no occipital adenopathy present.    She has cervical adenopathy (Anterior, superficial).       Right cervical: No superficial cervical, no deep cervical and no posterior cervical adenopathy present.      Left cervical: No superficial cervical, no deep cervical and no posterior cervical adenopathy present.  Neurological: She is alert.  Alert and oriented Moves all extremities without ataxia  Skin: Skin is warm and dry. She is not diaphoretic.  Sandpaper rash noted to her face, arms, chest and back No vesicles, whelps, urticaria or excoriations No petechiae, purpura or hemorrhages  Psychiatric: She has a normal mood and affect.  Nursing note and vitals reviewed.   ED Course  Procedures (including critical care time)  DIAGNOSTIC STUDIES: Oxygen Saturation is 98% on RA, normal by my interpretation.    COORDINATION OF CARE:  11:03 AM Will order rapid strep and pain medication.  Patient acknowledges and agrees with plan.    Labs Review Labs Reviewed  RAPID STREP SCREEN (NOT AT [facility])  CULTURE, GROUP A STREP    Imaging Review No results found.    EKG Interpretation None      MDM   Final diagnoses:  Scarlet fever  Bacterial pharyngitis   [name] presents with rash, sore throat, active fevers, mild throbbing headache.  Nursing note reports itching however patient denies this. No improvement with Benadryl. Evidence of bacterial pharyngitis on exam with tonsillar edema, erythema and exudate. Rash is consistent with scarlet  fever. Will treat for strep pharyngitis.  Rash is not consistent with contact dermatitis.  No evidence of secondary infection.  Rapid strep negative however patient treated for bacterial pharyngitis in the ED with steroids and PCN IM.  Will discharge home with NSAIDs and hydrocortisone cream. Pt appears mildly dehydrated, discussed importance of water rehydration. Presentation non concerning for PTA or infxn spread to soft tissue. No trismus or uvula deviation. Specific return precautions discussed. Pt able to drink water in ED without difficulty with intact air way. Recommended PCP follow up.   BP 127/69 mmHg  Pulse 89  Temp(Src) 98.4 F (36.9 C) (Oral)  Resp 18  Ht 5\' 4"  (1.626 m)  Wt 148 lb (67.132 kg)  BMI 25.39 kg/m2  SpO2 98%  LMP 09/02/2015  I personally performed the services described in this documentation, which was scribed in my presence. The recorded information has been reviewed and is accurate.     Dahlia ClientHannah Kagan Mutchler, PA-C 09/25/15 1217  Jerelyn ScottMartha Linker, MD 09/25/15 1229

## 2015-09-25 NOTE — Discharge Instructions (Signed)
1. Medications: ibuprofen for sore throat, hydrocortisone for rash if it itches, usual home medications 2. Treatment: rest, drink plenty of fluids,  3. Follow Up: Please followup with your primary doctor in 2-3 days for discussion of your diagnoses and further evaluation after today's visit; if you do not have a primary care doctor use the resource guide provided to find one; Please return to the ER for worsening symptoms, difficulty swallowing, high fevers or other concerns    Faringitis (Pharyngitis) La faringitis ocurre cuando la faringe presenta enrojecimiento, dolor e hinchazn (inflamacin).  CAUSAS  Normalmente, la faringitis se debe a una infeccin. Generalmente, estas infecciones ocurren debido a virus (viral) y se presentan cuando las personas se resfran. Sin embargo, a Advertising account executive faringitis es provocada por bacterias (bacteriana). Las alergias tambin pueden ser una causa de la faringitis. La faringitis viral se puede contagiar de Neomia Dear persona a otra al toser, estornudar y compartir objetos o utensilios personales (tazas, tenedores, cucharas, cepillos de diente). La faringitis bacteriana se puede contagiar de Neomia Dear persona a otra a travs de un contacto ms ntimo, como besar.  SIGNOS Y SNTOMAS  Los sntomas de la faringitis incluyen los siguientes:   Dolor de Advertising copywriter.  Cansancio (fatiga).  Fiebre no muy elevada.  Dolor de [place].  Dolores musculares y en las articulaciones.  Erupciones cutneas  Ganglios linfticos hinchados.  Una pelcula parecida a las placas en la garganta o las amgdalas (frecuente con la faringitis bacteriana). DIAGNSTICO  El mdico le har preguntas sobre la enfermedad y sus sntomas. Normalmente, todo lo que se necesita para diagnosticar una faringitis son sus antecedentes mdicos y un examen fsico. A veces se realiza una prueba rpida para estreptococos. Tambin es posible que se realicen otros anlisis de laboratorio, segn la posible causa.    TRATAMIENTO  La faringitis viral normalmente mejorar en un plazo de 3 a 4das sin medicamentos. La faringitis bacteriana se trata con medicamentos que [employer] grmenes (antibiticos).  INSTRUCCIONES PARA EL CUIDADO EN EL HOGAR   Beba gran cantidad de lquido para mantener la orina de tono claro o color amarillo plido.  Tome solo medicamentos de venta libre o recetados, segn las indicaciones del mdico.  Si le receta antibiticos, asegrese de terminarlos, incluso si comienza a Actor.  No tome aspirina.  Descanse lo suficiente.  Hgase grgaras con 8onzas ( ) de agua con sal (cucharadita de sal por litro de agua) cada 1 o 2horas para Science writer.  Puede usar pastillas (si no corre riesgo de Health visitor) o aerosoles para Science writer. SOLICITE ATENCIN MDICA SI:   Tiene bultos grandes y dolorosos en el cuello.  Tiene una erupcin cutnea.  Cuando tose elimina una expectoracin verde, amarillo amarronado o con [place]. SOLICITE ATENCIN MDICA DE INMEDIATO SI:   El cuello se pone rgido.  Comienza a babear o no puede tragar lquidos.  Vomita o no puede retener los [employer] lquidos.  Siente un dolor intenso que no se alivia con los medicamentos recomendados.  Tiene dificultades para respirar (y no debido a la nariz tapada). ASEGRESE DE QUE:   Comprende estas instrucciones.  Controlar su afeccin.  Recibir ayuda de inmediato si no mejora o si empeora.   Esta informacin no tiene Theme park manager el consejo del mdico. Asegrese de hacerle al mdico cualquier pregunta que tenga.   Document Released: 08/31/2005 Document Revised: 09/11/2013 Elsevier Interactive Patient Education [employer]    Emergency Department Resource Guide 1) Find a Librarian, academic and  Pay Out of Pocket Although you won't have to find out who is covered by your insurance plan, it is a good idea to ask around and get recommendations. You will then  need to call the office and see if the doctor you have chosen will accept you as a new patient and what types of options they offer for patients who are self-pay. Some doctors offer discounts or will set up payment plans for their patients who do not have insurance, but you will need to ask so you aren't surprised when you get to your appointment.  2) Contact Your Local Health Department Not all health departments have doctors that can see patients for sick visits, but many do, so it is worth a call to see if yours does. If you don't know where your local health department is, you can check in your phone book. The CDC also has a tool to help you locate your state's health department, and many state websites also have listings of all of their local health departments.  3) Find a Walk-in Clinic If your illness is not likely to be very severe or complicated, you may want to try a walk in clinic. These are popping up all over the country in pharmacies, drugstores, and shopping centers. They're usually staffed by nurse practitioners or physician assistants that have been trained to treat common illnesses and complaints. They're usually fairly quick and inexpensive. However, if you have serious medical issues or chronic medical problems, these are probably not your best option.  No Primary Care Doctor: - Call Health Connect at  [phone] - they can help you locate a primary care doctor that  accepts your insurance, provides certain services, etc. - Physician Referral Service- [phone]  Chronic Pain Problems: Organization         Address  Phone   Notes  [facility]  [phone] Patients need to be referred by their primary care doctor.   Medication Assistance: Organization         Address  Phone   Notes  [facility] [address] [place], [place] [address] [phone] --Must be a resident of [facility] [place] -- Must have NO  insurance coverage whatsoever (no Medicaid/ Medicare, etc.) -- The pt. MUST have a primary care doctor that directs their care regularly and follows them in the community   MedAssist  [phone]) [phone]   [employer]  [phone]    Agencies that provide inexpensive medical care: Organization         Address  Phone   Notes  [facility]  [phone]) [phone]   [facility]    [phone]   [facility] [address] [place], [place] [address] [phone]   [facility] [address], [place] [phone]) [phone]   Planned Parenthood    [phone]   [facility]    [phone]   [facility] and [facility]  [address], [place] Phone:  [phone], Fax:  [phone] Hours of Operation:  9 am - 6 pm, M-F.  Also accepts Medicaid/Medicare and self-pay.  [facility]  [address], [place] Phone: [phone]) [phone], Fax: [phone]. Hours of Operation:  8:30 am - 5:30 pm, M-F.  Also accepts Medicaid and self-pay.  [facility] [place] [address], [place] Phone: [phone]  [facility] [place], [place], [place] [phone], Ext. [phone] Mondays & Thursdays: 7-9 AM.  First 15 patients are seen on a first come, first serve basis.    [place] Providers:  Organization         Address  Phone   Notes  [facility] [address], [place] [phone] Also accepts self-pay patients.  [facility] [address], [place]  [phone]   [place], [address], [place] [phone]   [facility] [address] [place] [phone]   Lucianne Lei [address], [place]   [phone] Only accepts [place] Access [place] patients after they have  their name applied to their card.   Self-Pay (no insurance) in [facility]:  Organization         Address  Phone   Notes  Sickle Cell Patients, [facility] [place] [phone]   [facility] [place] [phone]   [facility] [place]  [place], [place], [place] [phone]   [facility]/Dr. Osei-Bonsu  [address] [place] or [address], [place] [phone] Phone number for both [place] and [place] locations is the same.  [facility] [address], [place] [phone]   [facility] [address], [place] or [address] [phone]   [facility] [address], [place] [phone], phone; [phone], fax Sees patients 1st and 3rd Saturday of every month.  Must not qualify for public or private insurance (i.e. Medicaid, Medicare, [place] Health Choice, Veterans' Benefits)  Household income should be no more than 200% of the poverty level The clinic cannot treat you if you are pregnant or think you are pregnant  Sexually transmitted diseases are not treated at the clinic.    Dental Care: Organization         Address  Phone  Notes  [facility] Department of [facility] [place] [phone] Accepts children up to age 43 who are enrolled in [place] or [place]; pregnant women with a Medicaid card; and children who have applied for Medicaid or [place] Health Choice, but were declined, whose parents can pay a reduced fee at time of service.  [facility] Department of [facility]  [address], [place] [phone] Accepts children up to age 48 who are enrolled in [place] or [place]; pregnant women with a Medicaid card; and children who have applied  for Medicaid or [place] Health Choice, but were declined, whose parents can pay a reduced fee at time of service.  [place] [facility] Access PRO[facility]  [place] [phone] Patients are seen by appointment only. Walk-ins are not accepted. [place] will see patients 13 years of age and older. Monday - Tuesday (8am-5pm) Most Wednesdays (8:30-5pm) $30 per visit, cash only  [facility] Adult [facility] Access PROGRAM  [address], [facility] [phone] Patients are seen by appointment only. Walk-ins are not accepted. [place] will see patients 71 years of age and older. One Wednesday Evening (Monthly: Volunteer Based).  $30 per visit,  cash only  [place]  [phone] for adults; Children under age 58, call Graduate Pediatric Dentistry at [phone]. Children aged 95-14, please call [phone] to request a pediatric application.  Dental services are provided in all areas of dental care including fillings, crowns and bridges, complete and partial dentures, implants, gum treatment, root canals, and extractions. Preventive care is also provided. Treatment is provided to both adults and children. Patients are selected via a lottery and there is often a waiting list.   [facility] [address] [place]  [phone] [email or web address]   [facility] [address] [place], [place] [phone], Ext. [phone] Second and Fourth Thursday of each month, opens at 6:30 AM; Clinic ends at 9 AM.  Patients are seen on a first-come first-served basis, and a limited number are seen during each clinic.   [facility]  [address] Hillard Danker [place], [place] [phone]   Eligibility Requirements You must have lived in [place], [place], or [place] counties for at least the last three months.   You cannot be eligible for state or federal sponsored [employer], including [employer],  [place], or [employer].   You generally cannot be eligible for healthcare insurance through your employer.    How to apply: Eligibility screenings are held every Tuesday and Wednesday afternoon from 1:00 pm until 4:00 pm. You do not need an appointment for the interview!  [facility] [address] [place], [place]   [place]  [place] Department  [place]  [phone]    Behavioral Health Resources in the Community: Intensive Outpatient Programs Organization         Address  Phone  Notes  [place]. [address] [place], [place] [phone]   [facility] [address] [place], [place]   ADS: Alcohol & Drug Svcs [address] [place], [place]   [place] [address],  [place], [place] or [phone]   Substance Abuse Resources Organization         Address  Phone  Notes  Alcohol and Drug Services  [phone]   [place]  [phone]   The [place]   Chinita Pester  [phone]   Residential & Outpatient Substance Abuse Program  [phone]   Psychological Services Organization         Address  Phone  Notes  [facility]  [place]  [phone]   [place] [address], [place] or [phone]    Mobile Crisis Teams Organization         Address  Phone  Notes  [facility], Mobile Crisis Care Unit  [phone]   [facility]  [address] [place], [place]   [place] Levels [address] [place] [phone]    Self-Help/Support Groups Organization         Address  Phone             Notes  [place]. of [place] - variety of support  groups  [place]af [place] Call for more information  Narcotics Anonymous (NA), [facility]aring [facility] [address], [employer] [place]  2 meetings at this location   Special educational needs teacher  Address  Phone  Notes  [facility] [address]    [place]  [phone]   [facility]  [address], [place] [address], [place], [place] [phone]   [facility] [address] [place], [place] [phone] Admissions: 8am-3pm M-F  [facility] [address],    [place], [place] [phone]   [facility] [address] [place], [place], [place] [phone]   [facility] [address]  [place], [place] [phone]   [facility] - Intensive Outpatient [address], [place], [place] [phone]   [facility] [address] [place].,  [place], [place] [phone] or [phone]   [facility] ([facility]) [address], [place], [place] [phone] Accepts Medicaid  [facility] [place] [address]  [place] [phone] Substance Abuse/Addiction Treatment   [facility] Organization         Address  Phone  Notes  [facility]  [phone]   Angie Fava, PhD [address] Ervin Knack [place], [place]   [phone] or [phone]   [facility]   [address] [place], [place] [phone]   [facility] [address] [place], [place] [phone] Insurance/Medicaid/sponsorship through [facility] and Families [address]                                    [place], [place] [phone] Therapy/tele-psych/case  [facility] [address], [place] [phone]    Dr. Lolly Mustache  [phone]   [facility] [place]  [employer] [facility] Dept. 1) [address], [place] 2) [address] [place] 3)   [address], [place] [phone]  [phone]   [facility] Child Abuse Hotline 681-013-5315 or [phone] (After Hours)

## 2015-09-25 NOTE — ED Notes (Signed)
C/o itching rash all over, especially her face. Has been taking Benadryl w/o improvement.

## 2015-09-25 NOTE — ED Notes (Signed)
Pt c/o generalized body rash onset x 5 days. Pt denies use of new products

## 2015-09-27 LAB — CULTURE, GROUP A STREP: Strep A Culture: NEGATIVE

## 2017-02-09 ENCOUNTER — Emergency Department (HOSPITAL_COMMUNITY): Payer: Self-pay

## 2017-02-09 ENCOUNTER — Encounter (HOSPITAL_COMMUNITY): Payer: Self-pay

## 2017-02-09 ENCOUNTER — Emergency Department (HOSPITAL_COMMUNITY)
Admission: EM | Admit: 2017-02-09 | Discharge: 2017-02-09 | Disposition: A | Discharge status: Home / Self Care | Payer: Self-pay | Attending: Emergency Medicine | Admitting: Emergency Medicine

## 2017-02-09 DIAGNOSIS — Z79899 Other long term (current) drug therapy: Secondary | ICD-10-CM | POA: Insufficient documentation

## 2017-02-09 DIAGNOSIS — R69 Illness, unspecified: Secondary | ICD-10-CM

## 2017-02-09 DIAGNOSIS — J111 Influenza due to unidentified influenza virus with other respiratory manifestations: Secondary | ICD-10-CM | POA: Insufficient documentation

## 2017-02-09 LAB — RAPID STREP SCREEN (MED CTR MEBANE ONLY): STREPTOCOCCUS, GROUP A SCREEN (DIRECT): NEGATIVE

## 2017-02-09 MED ORDER — IPRATROPIUM-ALBUTEROL 0.5-2.5 (3) MG/3ML IN SOLN
3.0000 mL | Freq: Once | RESPIRATORY_TRACT | Status: AC
  Administered 2017-02-09: 3 mL via RESPIRATORY_TRACT
  Filled 2017-02-09: qty 3

## 2017-02-09 MED ORDER — ALBUTEROL SULFATE HFA 108 (90 BASE) MCG/ACT IN AERS
2.0000 | INHALATION_SPRAY | RESPIRATORY_TRACT | Status: DC | PRN
  Filled 2017-02-09: qty 6.7

## 2017-02-09 MED ORDER — IBUPROFEN 400 MG PO TABS
600.0000 mg | ORAL_TABLET | Freq: Once | ORAL | Status: AC
  Administered 2017-02-09: 600 mg via ORAL
  Filled 2017-02-09: qty 1

## 2017-02-09 MED ORDER — SODIUM CHLORIDE 0.9 % IV BOLUS (SEPSIS)
1000.0000 mL | Freq: Once | INTRAVENOUS | Status: AC
  Administered 2017-02-09: 1000 mL via INTRAVENOUS

## 2017-02-09 MED ORDER — OSELTAMIVIR PHOSPHATE 75 MG PO CAPS
75.0000 mg | ORAL_CAPSULE | Freq: Once | ORAL | Status: AC
  Administered 2017-02-09: 75 mg via ORAL
  Filled 2017-02-09: qty 1

## 2017-02-09 MED ORDER — HYDROCOD POLST-CPM POLST ER 10-8 MG/5ML PO SUER: 5.0000 mL | Freq: Every evening | ORAL | 0 refills | Status: DC | PRN

## 2017-02-09 MED ORDER — OSELTAMIVIR PHOSPHATE 75 MG PO CAPS: 75.0000 mg | ORAL_CAPSULE | Freq: Two times a day (BID) | ORAL | 0 refills | Status: DC

## 2017-02-09 MED ORDER — BENZONATATE 100 MG PO CAPS: 100.0000 mg | ORAL_CAPSULE | Freq: Three times a day (TID) | ORAL | 0 refills | Status: DC

## 2017-02-09 NOTE — ED Notes (Signed)
Hope, NP aware of pt HR, will administer 1,000 ml bolus of NS per [facility], NP.

## 2017-02-09 NOTE — ED Notes (Signed)
EDP aware of pt HR and comfortable sending pt home. Pt states she feels much better. Pt appears to have improved after bolus

## 2017-02-09 NOTE — ED Notes (Signed)
Pt in X-Ray ?

## 2017-02-09 NOTE — ED Provider Notes (Signed)
[facility]-EMERGENCY DEPT Provider Note   CSN: [id number] Arrival date & time: 02/09/17  1727  By signing my name below, I, Doreatha MartinEva Mathews, attest that this documentation has been prepared under the direction and in the presence of  [facility]. Damian LeavellNeese, NP. Electronically Signed: Doreatha MartinEva Mathews, ED Scribe. 02/09/17. 6:51 PM.    History   Chief Complaint Chief Complaint  Patient presents with  . Fever  . Shortness of Breath    HPI Amanda Spears is a 36 y.o. female who presents to the Emergency Department complaining of intermittent productive cough with yellow sputum x 2 days with associated subjective fever, nasal congestion, HA, bilateral ear pain (L>R), sore throat, chills, SOB. Pt reports sick contact at home with similar symptoms. No worsening or alleviating factors noted. Pt denies taking OTC medications at home to improve symptoms. She denies abdominal pain, nausea, vomiting, additional complaints.     The history is provided by the patient. A language interpreter was used.  Fever   This is a new problem. The current episode started 2 days ago. The problem occurs constantly. The problem has not changed since onset.Her temperature was unmeasured prior to arrival. Associated symptoms include congestion, headaches, sore throat and cough. Pertinent negatives include no vomiting. She has tried nothing for the symptoms. The treatment provided no relief.    History reviewed. No pertinent past medical history.  There are no active problems to display for this patient.   History reviewed. No pertinent surgical history.  OB History    No data available       Home Medications    Prior to Admission medications   Medication Sig Start Date End Date Taking? Authorizing Provider  acetaminophen (TYLENOL) 325 MG tablet Take 650 mg by mouth every 6 (six) hours as needed for pain.    Historical Provider, MD  benzonatate (TESSALON) 100 MG capsule Take 1 capsule (100 mg total) by mouth every 8  (eight) hours. 02/09/17   Hope Orlene OchM Neese, NP  chlorpheniramine-HYDROcodone (TUSSIONEX PENNKINETIC ER) 10-8 MG/5ML SUER Take 5 mLs by mouth at bedtime as needed for cough. 02/09/17   Hope Orlene OchM Neese, NP  HYDROcodone-acetaminophen (NORCO/VICODIN) 5-325 MG per tablet Take 2 tablets by mouth every 4 (four) hours as needed for pain. 04/18/13   Junious SilkHannah Merrell, PA-C  methocarbamol (ROBAXIN) 500 MG tablet Take 1 tablet (500 mg total) by mouth 2 (two) times daily. 04/18/13   Junious SilkHannah Merrell, PA-C  oseltamivir (TAMIFLU) 75 MG capsule Take 1 capsule (75 mg total) by mouth every 12 (twelve) hours. 02/09/17   Hope Orlene OchM Neese, NP  promethazine (PHENERGAN) 25 MG tablet Take 1 tablet (25 mg total) by mouth every 6 (six) hours as needed for nausea. 04/18/13   Junious SilkHannah Merrell, PA-C    Family History No family history on file.  Social History Social History  Substance Use Topics  . Smoking status: Never Smoker  . Smokeless tobacco: Never Used  . Alcohol use No     Allergies   Patient has no known allergies.   Review of Systems Review of Systems  Constitutional: Positive for chills and fever.  HENT: Positive for congestion, ear pain and sore throat.   Respiratory: Positive for cough and shortness of breath.   Gastrointestinal: Negative for abdominal pain, nausea and vomiting.  Neurological: Positive for headaches.  All other systems reviewed and are negative.    Physical Exam Updated Vital Signs BP 125/90 (BP Location: Right Arm)   Pulse 112   Temp 99.8 F (37.7  C) (Oral)   Resp 16   Ht 5\' 1"  (1.549 m)   Wt 72.1 kg   LMP 02/08/2017   SpO2 100%   BMI 30.04 kg/m   Physical Exam  Constitutional: She appears well-developed and well-nourished.  Febrile at 102.8 on exam.   HENT:  Head: Normocephalic and atraumatic.  Right Ear: Tympanic membrane normal.  Left Ear: Tympanic membrane normal.  Nose: Right sinus exhibits maxillary sinus tenderness and frontal sinus tenderness. Left sinus exhibits maxillary  sinus tenderness and frontal sinus tenderness.  Mouth/Throat: Uvula is midline and mucous membranes are normal. Posterior oropharyngeal erythema present. No posterior oropharyngeal edema.  Mild erythema, no edema.   Eyes: EOM are normal. Pupils are equal, round, and reactive to light. No scleral icterus.  Neck: Normal range of motion.  Anterior cervical nodes that are enlarged  Cardiovascular: Regular rhythm.  Tachycardia present.   Pulmonary/Chest: Effort normal. No respiratory distress. She has wheezes. She has no rales.  Occasional wheezing to anterior lung fields.   Musculoskeletal: Normal range of motion.  Lymphadenopathy:    She has cervical adenopathy.  Neurological: She is alert.  Skin: Skin is warm and dry.  Psychiatric: She has a normal mood and affect. Her behavior is normal.  Nursing note and vitals reviewed.    ED Treatments / Results   DIAGNOSTIC STUDIES: Oxygen Saturation is 99% on RA, normal by my interpretation.    COORDINATION OF CARE: 6:47 PM Discussed treatment plan with pt at bedside which includes rapid strep and pt agreed to plan.    Labs (all labs ordered are listed, but only abnormal results are displayed) Labs Reviewed  RAPID STREP SCREEN (NOT AT [facility])  CULTURE, GROUP A STREP [facility])   Radiology Dg Chest 2 View  Result Date: 02/09/2017 CLINICAL DATA:  Reason for exam: cough, sore throat, fever since yesterday. No other medical hx provided. EXAM: CHEST - 2 VIEW COMPARISON:  08/22/2012 FINDINGS: Low volumes with some crowding of bronchovascular structures at the lung bases as before. No confluent airspace disease or overt edema. Heart size and mediastinal contours are within normal limits. No effusion. Visualized bones unremarkable. IMPRESSION: No acute cardiopulmonary disease. Electronically Signed   By: Corlis Leak M.D.   On: 02/09/2017 18:08    Procedures Procedures (including critical care time)  Medications Ordered in ED Medications  ibuprofen  (ADVIL,MOTRIN) tablet 600 mg (600 mg Oral Given 02/09/17 1909)  ipratropium-albuterol (DUONEB) 0.5-2.5 (3) MG/3ML nebulizer solution 3 mL (3 mLs Nebulization Given 02/09/17 1909)  oseltamivir (TAMIFLU) capsule 75 mg (75 mg Oral Given 02/09/17 2014)  sodium chloride 0.9 % bolus 1,000 mL (0 mLs Intravenous Stopped 02/09/17 2143)   After duoneb and IV hydration patient feeling much better and ready for d/c. Vital signs stable.   Initial Impression / Assessment and Plan / ED Course  I have reviewed the triage vital signs and the nursing notes.  Pertinent labs & imaging results that were available during my care of the patient were reviewed by me and considered in my medical decision making (see chart for details).   Patient with symptoms consistent with influenza.  Vitals are stable, low-grade fever.  No signs of dehydration, tolerating PO's.  Lungs are clear. Chest x-ray shows no pneumonia, pneumothorax or other acute findings.  Discussed Tamiflu treatment with the patient. Patient will be discharged with instructions to orally hydrate, rest, and use over-the-counter medications such as anti-inflammatories ibuprofen and Aleve for muscle aches and Tylenol for fever.  Patient will also  be given a cough suppressant and albuterol inhaler.   Final Clinical Impressions(s) / ED Diagnoses   Final diagnoses:  Influenza-like illness    New Prescriptions Discharge Medication List as of 02/09/2017  8:02 PM    START taking these medications   Details  benzonatate (TESSALON) 100 MG capsule Take 1 capsule (100 mg total) by mouth every 8 (eight) hours., Starting Thu 02/09/2017, Print    chlorpheniramine-HYDROcodone (TUSSIONEX PENNKINETIC ER) 10-8 MG/5ML SUER Take 5 mLs by mouth at bedtime as needed for cough., Starting Thu 02/09/2017, Print    oseltamivir (TAMIFLU) 75 MG capsule Take 1 capsule (75 mg total) by mouth every 12 (twelve) hours., Starting Thu 02/09/2017, Print        I personally performed the services  described in this documentation, which was scribed in my presence. The recorded information has been reviewed and is accurate.    [address] [place], NP 02/11/17 0134    Geoffery Lyons, MD 02/13/17 2224

## 2017-02-09 NOTE — ED Triage Notes (Signed)
Per Pt, Pt is coming from home with complaints of cough, congestion, sore throat, and fever x 2 days. Pt reports being around her family that is also sick. Reports episode of vomiting.

## 2017-02-09 NOTE — ED Notes (Signed)
Pt ambulatory to the restroom without difficulty.

## 2017-02-12 LAB — CULTURE, GROUP A STREP (THRC)

## 2018-05-10 ENCOUNTER — Other Ambulatory Visit: Payer: Self-pay

## 2018-05-10 ENCOUNTER — Emergency Department (HOSPITAL_COMMUNITY)
Admission: EM | Admit: 2018-05-10 | Discharge: 2018-05-10 | Disposition: A | Discharge status: Home / Self Care | Payer: Self-pay | Attending: Emergency Medicine | Admitting: Emergency Medicine

## 2018-05-10 ENCOUNTER — Emergency Department (HOSPITAL_COMMUNITY): Payer: Self-pay

## 2018-05-10 ENCOUNTER — Encounter (HOSPITAL_COMMUNITY): Payer: Self-pay | Admitting: Emergency Medicine

## 2018-05-10 DIAGNOSIS — J069 Acute upper respiratory infection, unspecified: Secondary | ICD-10-CM | POA: Insufficient documentation

## 2018-05-10 DIAGNOSIS — Z79899 Other long term (current) drug therapy: Secondary | ICD-10-CM | POA: Insufficient documentation

## 2018-05-10 LAB — CBC
HCT: 37.5 % (ref 36.0–46.0)
Hemoglobin: 11.7 g/dL — ABNORMAL LOW (ref 12.0–15.0)
MCH: 24.7 pg — AB (ref 26.0–34.0)
MCHC: 31.2 g/dL (ref 30.0–36.0)
MCV: 79.1 fL (ref 78.0–100.0)
PLATELETS: 498 10*3/uL — AB (ref 150–400)
RBC: 4.74 MIL/uL (ref 3.87–5.11)
RDW: 16.4 % — ABNORMAL HIGH (ref 11.5–15.5)
WBC: 10.9 10*3/uL — ABNORMAL HIGH (ref 4.0–10.5)

## 2018-05-10 LAB — COMPREHENSIVE METABOLIC PANEL
ALK PHOS: 83 U/L (ref 38–126)
ALT: 25 U/L (ref 14–54)
AST: 24 U/L (ref 15–41)
Albumin: 4 g/dL (ref 3.5–5.0)
Anion gap: 11 (ref 5–15)
BILIRUBIN TOTAL: 0.6 mg/dL (ref 0.3–1.2)
BUN: 5 mg/dL — ABNORMAL LOW (ref 6–20)
CALCIUM: 9.2 mg/dL (ref 8.9–10.3)
CO2: 22 mmol/L (ref 22–32)
CREATININE: 0.61 mg/dL (ref 0.44–1.00)
Chloride: 110 mmol/L (ref 101–111)
GFR calc non Af Amer: >60 mL/min (ref >60)
GLUCOSE: 123 mg/dL — AB (ref 65–99)
Potassium: 3.7 mmol/L (ref 3.5–5.1)
SODIUM: 143 mmol/L (ref 135–145)
TOTAL PROTEIN: 7.2 g/dL (ref 6.5–8.1)

## 2018-05-10 LAB — I-STAT BETA HCG BLOOD, ED (MC, WL, AP ONLY)

## 2018-05-10 LAB — LIPASE, BLOOD: Lipase: 35 U/L (ref 11–51)

## 2018-05-10 MED ORDER — AZITHROMYCIN 250 MG PO TABS: 250.0000 mg | ORAL_TABLET | Freq: Every day | ORAL | 0 refills | Status: DC

## 2018-05-10 MED ORDER — BENZONATATE 100 MG PO CAPS: 100.0000 mg | ORAL_CAPSULE | Freq: Three times a day (TID) | ORAL | 0 refills | Status: DC

## 2018-05-10 NOTE — ED Triage Notes (Addendum)
Pt presents to ED for assessment of productive cough with white/clear phlegm, sore throat, headaches, intermittent low-grade fevers.  Starting yesterday, with a strong coughing fit, patient began to have nausea and emesis.  Patient also c/o epigastric pain with coughing.

## 2018-05-10 NOTE — ED Provider Notes (Signed)
[facility] EMERGENCY DEPARTMENT Provider Note   CSN: [id number] Arrival date & time: 05/10/18  1142   History   Chief Complaint Chief Complaint  Patient presents with  . Cough  . Emesis    HPI Amanda Spears is a 37 y.o. female.  HPI   37 year old female presents today with complaints of upper respiratory infection.  Patient is Licensed conveyancer was used.  She notes a 2-week history of fever of 98.5, cough with clear sputum, nasal congestion and sore throat.  Patient notes taking Tylenol at home, denies any close sick contacts no recent travel.  Patient without any significant past medical history.  She is a non-smoker.     History reviewed. No pertinent past medical history.  There are no active problems to display for this patient.   History reviewed. No pertinent surgical history.   OB History   None      Home Medications    Prior to Admission medications   Medication Sig Start Date End Date Taking? Authorizing Provider  acetaminophen (TYLENOL) 325 MG tablet Take 650 mg by mouth every 6 (six) hours as needed for pain.    [provider]  azithromycin (ZITHROMAX) 250 MG tablet Take 1 tablet (250 mg total) by mouth daily. Take first 2 tablets together, then 1 every day until finished. 05/10/18   Hazely Sealey, Tinnie Gens, PA-C  benzonatate (TESSALON) 100 MG capsule Take 1 capsule (100 mg total) by mouth every 8 (eight) hours. 05/10/18   Lennox Dolberry, Tinnie Gens, PA-C  chlorpheniramine-HYDROcodone (TUSSIONEX PENNKINETIC ER) 10-8 MG/5ML SUER Take 5 mLs by mouth at bedtime as needed for cough. 02/09/17   Janne Napoleon, NP  HYDROcodone-acetaminophen (NORCO/VICODIN) 5-325 MG per tablet Take 2 tablets by mouth every 4 (four) hours as needed for pain. 04/18/13   [name], PA-C  methocarbamol (ROBAXIN) 500 MG tablet Take 1 tablet (500 mg total) by mouth 2 (two) times daily. 04/18/13   [name], PA-C  oseltamivir (TAMIFLU) 75 MG capsule Take 1  capsule (75 mg total) by mouth every 12 (twelve) hours. 02/09/17   Janne Napoleon, NP  promethazine (PHENERGAN) 25 MG tablet Take 1 tablet (25 mg total) by mouth every 6 (six) hours as needed for nausea. 04/18/13   [name], PA-C    Family History History reviewed. No pertinent family history.  Social History Social History   Tobacco Use  . Smoking status: Never Smoker  . Smokeless tobacco: Never Used  Substance Use Topics  . Alcohol use: No  . Drug use: No     Allergies   Patient has no known allergies.   Review of Systems Review of Systems  All other systems reviewed and are negative.    Physical Exam Updated Vital Signs BP 116/82   Pulse 98   Temp 99.2 F (37.3 C) (Oral)   Resp 16   LMP 05/07/2018   SpO2 99%   Physical Exam  Constitutional: She is oriented to person, place, and time. She appears well-developed and well-nourished.  HENT:  Head: Normocephalic and atraumatic.  Mouth/Throat: Uvula is midline and oropharynx is clear and moist. No oropharyngeal exudate, posterior oropharyngeal edema, posterior oropharyngeal erythema or tonsillar abscesses.  Eyes: Pupils are equal, round, and reactive to light. Conjunctivae are normal. Right eye exhibits no discharge. Left eye exhibits no discharge. No scleral icterus.  Neck: Normal range of motion. No JVD present. No tracheal deviation present.  Pulmonary/Chest: Effort normal and breath sounds normal. No stridor. No respiratory distress. She  has no wheezes. She has no rales. She exhibits no tenderness.  Neurological: She is alert and oriented to person, place, and time. Coordination normal.  Psychiatric: She has a normal mood and affect. Her behavior is normal. Judgment and thought content normal.  Nursing note and vitals reviewed.  ED Treatments / Results  Labs (all labs ordered are listed, but only abnormal results are displayed) Labs Reviewed  COMPREHENSIVE METABOLIC PANEL - Abnormal; Notable for the  following components:      Result Value   Glucose, Bld 123 (*)    BUN 5 (*)    All other components within normal limits  CBC - Abnormal; Notable for the following components:   WBC 10.9 (*)    Hemoglobin 11.7 (*)    MCH 24.7 (*)    RDW 16.4 (*)    Platelets 498 (*)    All other components within normal limits  LIPASE, BLOOD  I-STAT BETA HCG BLOOD, ED ([facility], [facility], [facility] ONLY)    EKG None  Radiology Dg Chest 2 View  Result Date: 05/10/2018 CLINICAL DATA:  Productive cough. Intermittent low-grade fevers since yesterday. EXAM: CHEST - 2 VIEW COMPARISON:  02/09/2017. FINDINGS: Normal sized heart. Clear lungs. Mild peribronchial thickening. Unremarkable bones. IMPRESSION: Mild bronchitic changes. Electronically Signed   By: Beckie SaltsSteven  Reid M.D.   On: 05/10/2018 12:53    Procedures Procedures (including critical care time)  Medications Ordered in ED Medications - No data to display   Initial Impression / Assessment and Plan / ED Course  I have reviewed the triage vital signs and the nursing notes.  Pertinent labs & imaging results that were available during my care of the patient were reviewed by me and considered in my medical decision making (see chart for details).     Labs: I-STAT beta-hCG, lipase, CMP, CBC  Imaging: DG chest 2 view  Consults:  Therapeutics:  Discharge Meds: Tessalon, azithromycin, Mucinex  Assessment/Plan: 37 year old female presents today with likely bronchitis.  2 weeks history of cough, chest x-ray showing bronchitis no pneumonia afebrile clear lung sounds, 99% oxygen saturation.  Patient has had 2 weeks of symptoms, I do find it reasonable to attempt therapy with cough medication and antibiotics if those do not improve her symptoms.  Patient is given strict return precautions, she verbalized understanding and agreement to today's plan had no further questions or concerns at time discharge.   Final Clinical Impressions(s) / ED Diagnoses   Final diagnoses:   Upper respiratory tract infection, unspecified type    ED Discharge Orders        Ordered    benzonatate (TESSALON) 100 MG capsule  Every 8 hours     05/10/18 1354    azithromycin (ZITHROMAX) 250 MG tablet  Daily     05/10/18 1354       Eyvonne MechanicHedges, Tascha Casares, PA-C 05/10/18 1355    Arby BarrettePfeiffer, Marcy, MD 05/11/18 1401

## 2018-05-10 NOTE — Discharge Instructions (Addendum)
Please read attached information. If you experience any new or worsening signs or symptoms please return to the emergency room for evaluation. Please follow-up with your primary care provider or specialist as discussed. Please use medication prescribed only as directed and discontinue taking if you have any concerning signs or symptoms.   °

## 2018-12-06 ENCOUNTER — Other Ambulatory Visit: Payer: Self-pay

## 2018-12-06 ENCOUNTER — Emergency Department (HOSPITAL_COMMUNITY): Payer: Self-pay

## 2018-12-06 ENCOUNTER — Emergency Department (HOSPITAL_BASED_OUTPATIENT_CLINIC_OR_DEPARTMENT_OTHER)
Admit: 2018-12-06 | Discharge: 2018-12-06 | Disposition: A | Discharge status: Home / Self Care | Payer: Self-pay | Attending: Emergency Medicine | Admitting: Emergency Medicine

## 2018-12-06 ENCOUNTER — Emergency Department (HOSPITAL_COMMUNITY)
Admission: EM | Admit: 2018-12-06 | Discharge: 2018-12-06 | Disposition: A | Discharge status: Home / Self Care | Payer: Self-pay | Attending: Emergency Medicine | Admitting: Emergency Medicine

## 2018-12-06 ENCOUNTER — Encounter (HOSPITAL_COMMUNITY): Payer: Self-pay | Admitting: Emergency Medicine

## 2018-12-06 DIAGNOSIS — M79605 Pain in left leg: Secondary | ICD-10-CM | POA: Insufficient documentation

## 2018-12-06 DIAGNOSIS — R609 Edema, unspecified: Secondary | ICD-10-CM

## 2018-12-06 DIAGNOSIS — M7989 Other specified soft tissue disorders: Secondary | ICD-10-CM

## 2018-12-06 DIAGNOSIS — Z79899 Other long term (current) drug therapy: Secondary | ICD-10-CM | POA: Insufficient documentation

## 2018-12-06 DIAGNOSIS — M79609 Pain in unspecified limb: Secondary | ICD-10-CM

## 2018-12-06 DIAGNOSIS — R079 Chest pain, unspecified: Secondary | ICD-10-CM

## 2018-12-06 DIAGNOSIS — R252 Cramp and spasm: Secondary | ICD-10-CM

## 2018-12-06 LAB — CBC WITH DIFFERENTIAL/PLATELET
Abs Immature Granulocytes: 0.02 10*3/uL (ref 0.00–0.07)
BASOS PCT: 1 %
Basophils Absolute: 0.1 10*3/uL (ref 0.0–0.1)
Eosinophils Absolute: 0.1 10*3/uL (ref 0.0–0.5)
Eosinophils Relative: 1 %
HCT: 38.8 % (ref 36.0–46.0)
Hemoglobin: 11.8 g/dL — ABNORMAL LOW (ref 12.0–15.0)
IMMATURE GRANULOCYTES: 0 %
Lymphocytes Relative: 26 %
Lymphs Abs: 2.5 10*3/uL (ref 0.7–4.0)
MCH: 24.1 pg — AB (ref 26.0–34.0)
MCHC: 30.4 g/dL (ref 30.0–36.0)
MCV: 79.2 fL — AB (ref 80.0–100.0)
MONO ABS: 0.6 10*3/uL (ref 0.1–1.0)
MONOS PCT: 6 %
NEUTROS PCT: 66 %
Neutro Abs: 6.1 10*3/uL (ref 1.7–7.7)
PLATELETS: 525 10*3/uL — AB (ref 150–400)
RBC: 4.9 MIL/uL (ref 3.87–5.11)
RDW: 16.3 % — ABNORMAL HIGH (ref 11.5–15.5)
WBC: 9.4 10*3/uL (ref 4.0–10.5)
nRBC: 0 % (ref 0.0–0.2)

## 2018-12-06 LAB — TROPONIN I
Troponin I: <0.03 ng/mL (ref <0.03)
Troponin I: <0.03 ng/mL (ref <0.03)

## 2018-12-06 LAB — BASIC METABOLIC PANEL
Anion gap: 15 (ref 5–15)
BUN: 9 mg/dL (ref 6–20)
CALCIUM: 9 mg/dL (ref 8.9–10.3)
CO2: 22 mmol/L (ref 22–32)
Chloride: 104 mmol/L (ref 98–111)
Creatinine, Ser: 0.8 mg/dL (ref 0.44–1.00)
GFR calc Af Amer: >60 mL/min (ref >60)
GLUCOSE: 142 mg/dL — AB (ref 70–99)
Potassium: 3.4 mmol/L — ABNORMAL LOW (ref 3.5–5.1)
Sodium: 141 mmol/L (ref 135–145)

## 2018-12-06 LAB — D-DIMER, QUANTITATIVE: D-Dimer, Quant: <0.27 ug/mL-FEU (ref 0.00–0.50)

## 2018-12-06 NOTE — Progress Notes (Signed)
Left lower extremity venous duplex exam completed. Please see preliminary notes on CV PROC under chart review. Krishawn Vanderweele H Lailani Tool(RDMS RVT) 12/06/18 10:43 AM

## 2018-12-06 NOTE — Discharge Instructions (Addendum)
Tests were all completely normal.  Suggest Tylenol or ibuprofen for pain.  Increase fluids.  Try to follow-up with your primary care doctor

## 2018-12-06 NOTE — ED Triage Notes (Signed)
Through interpreter, pt reports waking up at 630 am with sharp cramp in left leg and became dizzy with shortness of breath and left sided chest heaviness. Pt denies any recent travel. Speaking in full sentences.

## 2018-12-06 NOTE — ED Provider Notes (Signed)
[facility] EMERGENCY DEPARTMENT Provider Note   CSN: [id number] Arrival date & time: 12/06/18  0745     History   Chief Complaint Chief Complaint  Patient presents with  . Leg Pain  . Dizziness    HPI Tony Schurig is a 38 y.o. female.  Language line used.  Patient complains with chest pain, dyspnea, dizziness, cramping in left calf since early this morning.  Chest pain is described as a heaviness.  She is normally healthy.  No medications.  Non-smoker.  No family history of early coronary disease.  No recent travel or immobilization.  Social history:  works as a Land.  ROS positive for diaphoresis and headache.  Severity of symptoms is moderate.  Nothing makes symptoms better or worse.     History reviewed. No pertinent past medical history.  There are no active problems to display for this patient.   History reviewed. No pertinent surgical history.   OB History   No obstetric history on file.      Home Medications    Prior to Admission medications   Medication Sig Start Date End Date Taking? Authorizing Provider  acetaminophen (TYLENOL) 325 MG tablet Take 650 mg by mouth every 6 (six) hours as needed for pain.    [provider]  azithromycin (ZITHROMAX) 250 MG tablet Take 1 tablet (250 mg total) by mouth daily. Take first 2 tablets together, then 1 every day until finished. 05/10/18   Hedges, Tinnie Gens, PA-C  benzonatate (TESSALON) 100 MG capsule Take 1 capsule (100 mg total) by mouth every 8 (eight) hours. 05/10/18   Hedges, Tinnie Gens, PA-C  chlorpheniramine-HYDROcodone (TUSSIONEX PENNKINETIC ER) 10-8 MG/5ML SUER Take 5 mLs by mouth at bedtime as needed for cough. 02/09/17   Janne Napoleon, NP  HYDROcodone-acetaminophen (NORCO/VICODIN) 5-325 MG per tablet Take 2 tablets by mouth every 4 (four) hours as needed for pain. 04/18/13   [name], PA-C  methocarbamol (ROBAXIN) 500 MG tablet Take 1 tablet (500 mg total) by mouth 2 (two)  times daily. 04/18/13   [name], PA-C  oseltamivir (TAMIFLU) 75 MG capsule Take 1 capsule (75 mg total) by mouth every 12 (twelve) hours. 02/09/17   Janne Napoleon, NP  promethazine (PHENERGAN) 25 MG tablet Take 1 tablet (25 mg total) by mouth every 6 (six) hours as needed for nausea. 04/18/13   [name], PA-C    Family History No family history on file.  Social History Social History   Tobacco Use  . Smoking status: Never Smoker  . Smokeless tobacco: Never Used  Substance Use Topics  . Alcohol use: No  . Drug use: No     Allergies   Patient has no known allergies.   Review of Systems Review of Systems  All other systems reviewed and are negative.    Physical Exam Updated Vital Signs BP 133/83 (BP Location: Right Arm)   Pulse 95   Temp 99.5 F (37.5 C) (Oral)   Resp 14   LMP 11/30/2018   SpO2 98%   Physical Exam Vitals signs and nursing note reviewed.  Constitutional:      Appearance: She is well-developed.  HENT:     Head: Normocephalic and atraumatic.  Eyes:     Conjunctiva/sclera: Conjunctivae normal.  Neck:     Musculoskeletal: Neck supple.  Cardiovascular:     Rate and Rhythm: Normal rate and regular rhythm.  Pulmonary:     Effort: Pulmonary effort is normal.     Breath sounds:  Normal breath sounds.  Abdominal:     General: Bowel sounds are normal.     Palpations: Abdomen is soft.  Musculoskeletal: Normal range of motion.     Comments: Tender in the left posterior calf  Skin:    General: Skin is warm and dry.  Neurological:     Mental Status: She is alert and oriented to person, place, and time.  Psychiatric:        Behavior: Behavior normal.      ED Treatments / Results  Labs (all labs ordered are listed, but only abnormal results are displayed) Labs Reviewed  CBC WITH DIFFERENTIAL/PLATELET - Abnormal; Notable for the following components:      Result Value   Hemoglobin 11.8 (*)    MCV 79.2 (*)    MCH 24.1 (*)    RDW 16.3  (*)    Platelets 525 (*)    All other components within normal limits  BASIC METABOLIC PANEL  TROPONIN I  D-DIMER, QUANTITATIVE (NOT AT [facility])    EKG None  Radiology Dg Chest 2 View  Result Date: 12/06/2018 CLINICAL DATA:  Cough and chest pain EXAM: CHEST - 2 VIEW COMPARISON:  May 10, 2018 FINDINGS: The lungs are clear. The heart size and pulmonary vascularity are normal. No adenopathy. No pneumothorax. No bone lesions. IMPRESSION: No edema or consolidation. Electronically Signed   By: Bretta Bang III M.D.   On: 12/06/2018 08:35    Procedures Procedures (including critical care time)  Medications Ordered in ED Medications - No data to display   Initial Impression / Assessment and Plan / ED Course  I have reviewed the triage vital signs and the nursing notes.  Pertinent labs & imaging results that were available during my care of the patient were reviewed by me and considered in my medical decision making (see chart for details).     Patient is low risk for ACS or PE.  Will obtain screening tests including ultrasound of left lower extremity.  Final Clinical Impressions(s) / ED Diagnoses   Final diagnoses:  Chest pain, unspecified type    ED Discharge Orders    None       Donnetta Hutching, MD 12/06/18 [id number]

## 2018-12-27 ENCOUNTER — Encounter (HOSPITAL_COMMUNITY): Payer: Self-pay | Admitting: Emergency Medicine

## 2021-06-29 ENCOUNTER — Emergency Department (HOSPITAL_COMMUNITY)
Admission: EM | Admit: 2021-06-29 | Discharge: 2021-06-29 | Disposition: A | Discharge status: Home / Self Care | Payer: Self-pay | Attending: Emergency Medicine | Admitting: Emergency Medicine

## 2021-06-29 ENCOUNTER — Other Ambulatory Visit: Payer: Self-pay

## 2021-06-29 DIAGNOSIS — H6691 Otitis media, unspecified, right ear: Secondary | ICD-10-CM | POA: Insufficient documentation

## 2021-06-29 DIAGNOSIS — H6690 Otitis media, unspecified, unspecified ear: Secondary | ICD-10-CM

## 2021-06-29 MED ORDER — AMOXICILLIN-POT CLAVULANATE 875-125 MG PO TABS: 1.0000 | ORAL_TABLET | Freq: Two times a day (BID) | ORAL | 0 refills | Status: DC

## 2021-06-29 NOTE — ED Triage Notes (Signed)
Pt reports R sided facial swelling and pain since last night at 2100. Endorses low grade fever, for which she took tylenol this morning.

## 2021-06-29 NOTE — Discharge Instructions (Addendum)
Tome Augmentin [employer] al da, asegrese de tomar el antibitico con las comidas para Programme researcher, broadcasting/film/video. Use ibuprofeno y Tylenol segn sea necesario para Chief Technology Officer. Debera comenzar a ver alguna mejora despus de 2 das completos de antibiticos. Haga un seguimiento con su mdico de atencin primaria para ver si hay sntomas nuevos o que empeoran.  ----------------------------------------------------------  Take Augmentin twice daily, be sure to take antibiotic with food to prevent stomach upset.  Use ibuprofen and Tylenol as needed for pain.  You should start to see some improvement after 2 full days of antibiotics.  Follow-up with your primary care doctor return for new or worsening symptoms.

## 2021-06-29 NOTE — ED Provider Notes (Signed)
[facility] EMERGENCY DEPARTMENT Provider Note   CSN: [id number] Arrival date & time: 06/29/21  1011     History Chief Complaint  Patient presents with   Facial Pain    Behind right ear     Amanda Spears is a 40 y.o. female.  Amanda Spears is a 40 y.o. female who is otherwise healthy, presents to the ED for evaluation of right-sided facial pain.  Patient reports pain started last night just behind her right ear.  She reports overnight pain seemed to improve but then returned this morning.  She reports she started to have some worsening pain in and around the ear and over the right side of the face and intermittently felt some tingling.  No associated eye pain, nasal congestion, sinus pressure, sore throat or cough.  Denies any dental pain.  No ear drainage or change in hearing.  She does report a headache and felt flushed, no documented fever at home.  No known sick contacts.  No meds prior to arrival.  No other aggravating or alleviating factors.  The history is provided by the patient. The history is limited by a language barrier. A language interpreter was used.      No past medical history on file.  There are no problems to display for this patient.   No past surgical history on file.   OB History   No obstetric history on file.     No family history on file.  Social History   Tobacco Use   Smoking status: Never   Smokeless tobacco: Never  Substance Use Topics   Alcohol use: No   Drug use: No    Home Medications Prior to Admission medications   Medication Sig Start Date End Date Taking? Authorizing Provider  HYDROcodone-acetaminophen (NORCO/VICODIN) 5-325 MG per tablet Take 2 tablets by mouth every 4 (four) hours as needed for pain. Patient not taking: Reported on 12/06/2018 04/18/13   [name], PA-C  ibuprofen (ADVIL,MOTRIN) 800 MG tablet Take 1 tablet (800 mg total) by mouth 3 (three) times daily. 09/25/15    Muthersbaugh, Dahlia Client, PA-C  ipratropium (ATROVENT) 0.06 % nasal spray Place 2 sprays into both nostrils 4 (four) times daily. 11/04/14   Ozella Rocks, MD  methocarbamol (ROBAXIN) 500 MG tablet Take 1 tablet (500 mg total) by mouth 2 (two) times daily. Patient not taking: Reported on 12/06/2018 04/18/13   [name], PA-C  ondansetron (ZOFRAN) 4 MG tablet Take 1 tablet (4 mg total) by mouth every 8 (eight) hours as needed for nausea or vomiting. 11/04/14   Ozella Rocks, MD  Pseudoeph-Doxylamine-DM-APAP (NYQUIL PO) Take by mouth.    [provider]    Allergies    Patient has no known allergies.  Review of Systems   Review of Systems  Constitutional:  Negative for chills and fever.  HENT:  Positive for ear pain. Negative for congestion, facial swelling, rhinorrhea, sinus pain and sore throat.   Respiratory:  Negative for cough and shortness of breath.   Cardiovascular:  Negative for chest pain.  Gastrointestinal:  Negative for nausea and vomiting.  Musculoskeletal:  Negative for myalgias, neck pain and neck stiffness.  Skin:  Negative for color change and rash.  Neurological:  Positive for headaches.  All other systems reviewed and are negative.  Physical Exam Updated Vital Signs BP 105/76   Pulse 66   Temp 98.7 F (37.1 C) (Oral)   Resp 18   SpO2 100%   Physical Exam  Vitals and nursing note reviewed.  Constitutional:      General: She is not in acute distress.    Appearance: Normal appearance. She is well-developed. She is not ill-appearing or diaphoretic.  HENT:     Head: Normocephalic and atraumatic.     Comments: No facial swelling     Right Ear: A middle ear effusion is present. Tympanic membrane is erythematous.     Left Ear: Tympanic membrane and ear canal normal.     Ears:     Comments: Right ear with erythematous TM with purulent effusion, some postauricular tenderness, no focal mastoid tenderness    Nose: Nose normal.     Mouth/Throat:     Mouth:  Mucous membranes are moist.     Pharynx: Oropharynx is clear.  Eyes:     General:        Right eye: No discharge.        Left eye: No discharge.     Extraocular Movements: Extraocular movements intact.     Pupils: Pupils are equal, round, and reactive to light.  Neck:     Comments: No lymphadenopathy, no rigidity Pulmonary:     Effort: Pulmonary effort is normal. No respiratory distress.     Breath sounds: Normal breath sounds.  Musculoskeletal:     Cervical back: Neck supple. No rigidity.  Skin:    General: Skin is warm and dry.  Neurological:     Mental Status: She is alert and oriented to person, place, and time.     Coordination: Coordination normal.  Psychiatric:        Mood and Affect: Mood normal.        Behavior: Behavior normal.    ED Results / Procedures / Treatments   Labs (all labs ordered are listed, but only abnormal results are displayed) Labs Reviewed - No data to display  EKG None  Radiology No results found.  Procedures Procedures   Medications Ordered in ED Medications - No data to display  ED Course  I have reviewed the triage vital signs and the nursing notes.  Pertinent labs & imaging results that were available during my care of the patient were reviewed by me and considered in my medical decision making (see chart for details).    MDM Rules/Calculators/A&P                           Patient presents with otalgia and exam consistent with acute otitis media. No concern for acute mastoiditis, meningitis. Patient discharged home with Augmentin.  Advised patient on symptomatic treatment at home and outpatient follow-up.  I have also discussed reasons to return immediately to the ER.  Patient expresses understanding and agrees with plan.  Discussed discharge instructions and answered all questions using Spanish interpreter.  Final Clinical Impression(s) / ED Diagnose  Rx / DC Orders ED Discharge Orders     None        Dartha Lodge,  PA-C 06/29/21 1938    Rozelle Logan, DO 07/01/21 1507

## 2021-06-29 NOTE — ED Provider Notes (Signed)
Emergency Medicine Provider Triage Evaluation Note  Amanda Spears , a 40 y.o. female  was evaluated in triage.  Pt complains of R facial swelling starting last night. Reports subjective fever. No SOB, trouble swallowing.   Review of Systems  Positive: Facial swelling Negative: Trouble breathing  Physical Exam  BP 122/80   Pulse 89   Temp 98.7 F (37.1 C) (Oral)   Resp 14   SpO2 98%  Gen:   Awake, no distress   Resp:  Normal effort  MSK:   Moves extremities without difficulty  Other:  No obvious dental abscess, trace R facial swelling  Medical Decision Making  Medically screening exam initiated at 10:23 AM.  Appropriate orders placed.  Jerene Romero-Gomez was informed that the remainder of the evaluation will be completed by another provider, this initial triage assessment does not replace that evaluation, and the importance of remaining in the ED until their evaluation is complete.     Renne Crigler, PA-C 06/29/21 1024    Cathren Laine, MD 07/01/21 1315

## 2022-03-12 ENCOUNTER — Encounter (HOSPITAL_BASED_OUTPATIENT_CLINIC_OR_DEPARTMENT_OTHER): Payer: Self-pay | Admitting: Emergency Medicine

## 2022-03-12 ENCOUNTER — Emergency Department (HOSPITAL_BASED_OUTPATIENT_CLINIC_OR_DEPARTMENT_OTHER)
Admission: EM | Admit: 2022-03-12 | Discharge: 2022-03-12 | Disposition: A | Discharge status: Home / Self Care | Payer: Self-pay | Attending: Emergency Medicine | Admitting: Emergency Medicine

## 2022-03-12 DIAGNOSIS — L239 Allergic contact dermatitis, unspecified cause: Secondary | ICD-10-CM | POA: Insufficient documentation

## 2022-03-12 MED ORDER — PREDNISONE 50 MG PO TABS
60.0000 mg | ORAL_TABLET | Freq: Once | ORAL | Status: AC
  Administered 2022-03-12: 60 mg via ORAL
  Filled 2022-03-12: qty 1

## 2022-03-12 MED ORDER — PREDNISONE 10 MG (21) PO TBPK: ORAL_TABLET | ORAL | 0 refills | Status: DC

## 2022-03-12 MED ORDER — DIPHENHYDRAMINE HCL 25 MG PO CAPS
50.0000 mg | ORAL_CAPSULE | Freq: Once | ORAL | Status: AC
  Administered 2022-03-12: 50 mg via ORAL
  Filled 2022-03-12: qty 2

## 2022-03-12 NOTE — ED Triage Notes (Addendum)
Pt has bilateral rash on arms and face. Started to spread but started Thursday and was working out in yard. Itching. They are weeping. OTC itch cream used so far does not seem to be helping. No PO meds taken and denies SOB or throat swelling.  ?

## 2022-03-12 NOTE — ED Provider Notes (Signed)
? ?  [facility] EMERGENCY DEPT  ?Provider Note ? ?CSN: [id number] ?Arrival date & time: 03/12/22 2244 ? ?History ?Chief Complaint  ?Patient presents with  ? Rash  ? ? ?Lyne Romero-Gomez is a 41 y.o. female presents with her son who helps interpret at her request, declines video interpreter. She has had 2 days of itchy rash to both forearms and face since working in the yard. She denies any insect bites. She has tried creams without improvement.  ? ? ?Home Medications ?Prior to Admission medications   ?Medication Sig Start Date End Date Taking? Authorizing Provider  ?predniSONE (STERAPRED UNI-PAK 21 TAB) 10 MG (21) TBPK tablet 10mg  Tabs, 6 day taper. Use as directed 03/12/22  Yes 05/12/22, MD  ?ipratropium (ATROVENT) 0.06 % nasal spray Place 2 sprays into both nostrils 4 (four) times daily. 11/04/14   14/1/15, MD  ?Pseudoeph-Doxylamine-DM-APAP (NYQUIL PO) Take by mouth.    [provider]  ? ? ? ?Allergies    ?Patient has no known allergies. ? ? ?Review of Systems   ?Review of Systems ?Please see HPI for pertinent positives and negatives ? ?Physical Exam ?BP (!) 120/92 (BP Location: Right Arm)   Pulse 92   Temp 98.7 ?F (37.1 ?C) (Oral)   Resp 18   LMP 03/01/2022   SpO2 98%  ? ?Physical Exam ?Vitals and nursing note reviewed.  ?HENT:  ?   Head: Normocephalic.  ?   Nose: Nose normal.  ?Eyes:  ?   Extraocular Movements: Extraocular movements intact.  ?Pulmonary:  ?   Effort: Pulmonary effort is normal.  ?   Breath sounds: No stridor.  ?Musculoskeletal:     ?   General: Normal range of motion.  ?   Cervical back: Neck supple.  ?Skin: ?   Findings: Rash (contact dermatitis on both forearms and face) present.  ?Neurological:  ?   Mental Status: She is alert and oriented to person, place, and time.  ?Psychiatric:     ?   Mood and Affect: Mood normal.  ? ? ?ED Results / Procedures / Treatments   ?EKG ?None ? ?Procedures ?Procedures ? ?Medications Ordered in the ED ?Medications   ?predniSONE (DELTASONE) tablet 60 mg (has no administration in time range)  ?diphenhydrAMINE (BENADRYL) capsule 50 mg (has no administration in time range)  ? ? ?Initial Impression and Plan ? Patient with contact dermatitis, likely poison ivy/oak given she was working in her yard prior to onset. Advised to make sure the clothes she was wearing have been washed in hot water. Rx for prednisone, benadryl for itching.  ? ?ED Course  ? ?  ? ? ?MDM Rules/Calculators/A&P ?Medical Decision Making ?Risk ?OTC drugs. ?Prescription drug management. ? ? ? ?Final Clinical Impression(s) / ED Diagnoses ?Final diagnoses:  ?Allergic contact dermatitis, unspecified trigger  ? ? ?Rx / DC Orders ?ED Discharge Orders   ? ?      Ordered  ?  predniSONE (STERAPRED UNI-PAK 21 TAB) 10 MG (21) TBPK tablet       ? 03/12/22 2330  ? ?  ?  ? ?  ? ?  ?2331, MD ?03/12/22 2330 ? ?

## 2022-09-15 ENCOUNTER — Emergency Department (HOSPITAL_BASED_OUTPATIENT_CLINIC_OR_DEPARTMENT_OTHER)
Admission: EM | Admit: 2022-09-15 | Discharge: 2022-09-15 | Disposition: A | Discharge status: Home / Self Care | Payer: Self-pay | Attending: Emergency Medicine | Admitting: Emergency Medicine

## 2022-09-15 ENCOUNTER — Encounter (HOSPITAL_BASED_OUTPATIENT_CLINIC_OR_DEPARTMENT_OTHER): Payer: Self-pay | Admitting: Emergency Medicine

## 2022-09-15 ENCOUNTER — Other Ambulatory Visit: Payer: Self-pay

## 2022-09-15 DIAGNOSIS — M542 Cervicalgia: Secondary | ICD-10-CM | POA: Insufficient documentation

## 2022-09-15 MED ORDER — DICLOFENAC SODIUM 1 % EX GEL: 4.0000 g | Freq: Four times a day (QID) | CUTANEOUS | 0 refills | Status: DC

## 2022-09-15 MED ORDER — METHOCARBAMOL 500 MG PO TABS: 500.0000 mg | ORAL_TABLET | Freq: Two times a day (BID) | ORAL | 0 refills | Status: DC

## 2022-09-15 MED ORDER — METHOCARBAMOL 500 MG PO TABS
1000.0000 mg | ORAL_TABLET | Freq: Once | ORAL | Status: AC
  Administered 2022-09-15: 1000 mg via ORAL
  Filled 2022-09-15: qty 2

## 2022-09-15 MED ORDER — IBUPROFEN 600 MG PO TABS: 600.0000 mg | ORAL_TABLET | Freq: Four times a day (QID) | ORAL | 0 refills | Status: DC | PRN

## 2022-09-15 MED ORDER — IBUPROFEN 400 MG PO TABS
600.0000 mg | ORAL_TABLET | Freq: Once | ORAL | Status: AC
  Administered 2022-09-15: 600 mg via ORAL
  Filled 2022-09-15: qty 1

## 2022-09-15 MED ORDER — LIDOCAINE 5 % EX PTCH
1.0000 | MEDICATED_PATCH | CUTANEOUS | Status: DC
  Administered 2022-09-15: 1 via TRANSDERMAL
  Filled 2022-09-15: qty 1

## 2022-09-15 NOTE — Discharge Instructions (Addendum)
Su examen de hoy es muy preocupante para una lesin muscular 1. Medicamentos: alternar ibuprofeno y tylenol para Financial controller, tomar todos los medicamentos caseros habituales segn lo recetado 2. Tratamiento: reposo, hielo, elevacin y uso de una envoltura ACE u otra terapia de compresin para disminuir la hinchazn. Tambin beba mucho lquido y haga muchos estiramientos suaves y [place] el msculo afectado a travs de su rango normal de movimiento para Mining engineer rigidez. 3. Seguimiento: Si sus sntomas no mejoran, haga un seguimiento con ortopedia/medicina deportiva o con su PCP para discutir sus diagnsticos y [place] evaluacin adicional despus de la visita de hoy; Si no tiene un mdico de [employer], use la gua de recursos proporcionada para Child psychotherapist; Regrese a la sala de emergencias si [employer] sntomas u otras inquietudes.

## 2022-09-15 NOTE — ED Triage Notes (Signed)
Triage completed with family member as interpreter. Professional interpreting services offered and denied by patient

## 2022-09-15 NOTE — ED Triage Notes (Signed)
Neck pain started 2 weeks ago, reports there was a "bump on back of neck " but it has since gone down. Denies injury prior to pain Tylenol gives mild relief

## 2022-09-15 NOTE — ED Provider Notes (Signed)
[facility] EMERGENCY DEPT Provider Note   CSN: [id number] Arrival date & time: 09/15/22  1552     History  Chief Complaint  Patient presents with   Neck Pain    Amanda Spears is a 41 y.o. female.   Neck Pain Patient is a 41 year old female with no pertinent past medical history she is Spanish-speaking and presents with son who she request to be translated  She states that she has had neck pain for 2 weeks.  She states it is her posterior neck.  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  She denies any numbness or weakness in her hands.  She states that she felt that she had a bump on the back of her neck at 1 point that is since resolved.  She denies any fevers lightheadedness or dizziness.  No numbness weakness slurred speech or confusion.  No visual changes such as blurred vision double vision     Home Medications Prior to Admission medications   Medication Sig Start Date End Date Taking? Authorizing Provider  diclofenac Sodium (VOLTAREN) 1 % GEL Apply 4 g topically 4 (four) times daily. 09/15/22  Yes Ulisses Vondrak S, PA  ibuprofen (ADVIL) 600 MG tablet Take 1 tablet (600 mg total) by mouth every 6 (six) hours as needed. 09/15/22  Yes Elsbeth Yearick S, PA  methocarbamol (ROBAXIN) 500 MG tablet Take 1 tablet (500 mg total) by mouth 2 (two) times daily. 09/15/22  Yes Damarius Karnes S, PA  ipratropium (ATROVENT) 0.06 % nasal spray Place 2 sprays into both nostrils 4 (four) times daily. 11/04/14   Ozella Rocks, MD  predniSONE (STERAPRED UNI-PAK 21 TAB) 10 MG (21) TBPK tablet 10mg  Tabs, 6 day taper. Use as directed 03/12/22   05/12/22, MD  Pseudoeph-Doxylamine-DM-APAP (NYQUIL PO) Take by mouth.    [provider]      Allergies    Patient has no known allergies.    Review of Systems   Review of Systems  Musculoskeletal:  Positive for neck pain.     Physical Exam Updated Vital Signs BP 109/68   Pulse 78   Temp 98.4 F (36.9 C) (Oral)   Resp 17   LMP 09/01/2022   SpO2 97%  Physical Exam Vitals and nursing note reviewed.  Constitutional:      General: She is not in acute distress.    Appearance: Normal appearance. She is not ill-appearing.  HENT:     Head: Normocephalic and atraumatic.  Eyes:     General: No scleral icterus.       Right eye: No discharge.        Left eye: No discharge.     Conjunctiva/sclera: Conjunctivae normal.  Pulmonary:     Effort: Pulmonary effort is normal.     Breath sounds: No stridor.  Musculoskeletal:     Comments: Diffuse tenderness to palpation of paracervical musculature.  No significant midline tenderness.  No step-off deformity.  Neurological:     Mental Status: She is alert and oriented to person, place, and time. Mental status is at baseline.     Comments: Moves all 4 extremities, grip 5/5 bilaterally, sensation intact in upper and lower extremities     ED Results / Procedures / Treatments   Labs (all labs ordered are listed, but only abnormal results are displayed) Labs Reviewed - No data to display  EKG None  Radiology No results  found.  Procedures Procedures    Medications Ordered in ED Medications  lidocaine (LIDODERM) 5 % 1 patch (1 patch Transdermal Patch Applied 09/15/22 2136)  methocarbamol (ROBAXIN) tablet 1,000 mg (1,000 mg Oral Given 09/15/22 2136)  ibuprofen (ADVIL) tablet 600 mg (600 mg Oral Given 09/15/22 2136)    ED Course/ Medical Decision Making/ A&P                           Medical Decision Making Risk Prescription drug management.   Patient is a 41 year old female with no pertinent past medical history she is Spanish-speaking and presents with son who she request to be translated  She states that she has had neck pain for 2 weeks.  She states it is her posterior neck.  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  She denies any numbness or weakness in her hands.  She states that she felt that she had a bump on the back of her neck at 1 point that is since resolved.  She denies any fevers lightheadedness or dizziness.  No numbness weakness slurred speech or confusion.  No visual changes such as blurred vision double vision   Physical exam without any significant abnormal findings.  No neurologic changes and patient is overall well-appearing.  Has not had any ibuprofen or muscle relaxer yet will apply Lidoderm patches and provide with 1 dose of each of these.  We will treat conservatively as muscle spasm.  This is likely exacerbated by her work.  Surgical precautions were discussed and recommendations to follow-up with primary care.  I given her the information for the [place] and wellness clinic   Final Clinical Impression(s) / ED Diagnoses Final diagnoses:  Neck pain    Rx / DC Orders ED Discharge Orders          Ordered    methocarbamol (ROBAXIN) 500 MG tablet  2 times daily        09/15/22 2126    ibuprofen (ADVIL) 600 MG tablet  Every 6 hours PRN        09/15/22 2126    diclofenac Sodium (VOLTAREN) 1 % GEL  4 times daily        09/15/22 2126              Tedd Sias, [place] 09/15/22 2203    Malvin Johns, MD 09/15/22 2345

## 2023-07-06 ENCOUNTER — Emergency Department (HOSPITAL_BASED_OUTPATIENT_CLINIC_OR_DEPARTMENT_OTHER): Payer: Self-pay | Admitting: Radiology

## 2023-07-06 ENCOUNTER — Encounter (HOSPITAL_BASED_OUTPATIENT_CLINIC_OR_DEPARTMENT_OTHER): Payer: Self-pay | Admitting: Emergency Medicine

## 2023-07-06 ENCOUNTER — Emergency Department (HOSPITAL_BASED_OUTPATIENT_CLINIC_OR_DEPARTMENT_OTHER)
Admission: EM | Admit: 2023-07-06 | Discharge: 2023-07-06 | Disposition: A | Discharge status: Home / Self Care | Payer: Self-pay | Attending: Emergency Medicine | Admitting: Emergency Medicine

## 2023-07-06 ENCOUNTER — Other Ambulatory Visit: Payer: Self-pay

## 2023-07-06 DIAGNOSIS — M25562 Pain in left knee: Secondary | ICD-10-CM | POA: Insufficient documentation

## 2023-07-06 HISTORY — DX: Essential (primary) hypertension: I10

## 2023-07-06 MED ORDER — IBUPROFEN 600 MG PO TABS: 600.0000 mg | ORAL_TABLET | Freq: Four times a day (QID) | ORAL | 0 refills | Status: DC | PRN

## 2023-07-06 MED ORDER — ACETAMINOPHEN 325 MG PO TABS
650.0000 mg | ORAL_TABLET | Freq: Once | ORAL | Status: AC
  Administered 2023-07-06: 650 mg via ORAL
  Filled 2023-07-06: qty 2

## 2023-07-06 MED ORDER — ACETAMINOPHEN 500 MG PO TABS: 500.0000 mg | ORAL_TABLET | Freq: Four times a day (QID) | ORAL | 0 refills | Status: DC | PRN

## 2023-07-06 NOTE — ED Notes (Signed)
Discharge paperwork given and verbally understood. 

## 2023-07-06 NOTE — Discharge Instructions (Addendum)
It was a pleasure taking care of you!   Your x-ray was negative for fracture or dislocation.  You will be sent a prescription for ibuprofen and tylenol, take as directed. Ensure to keep the leg elevated with ice placed to the area up to 15-20 minutes at a time. Attached is information for the on-call Orthopedist, you may call and set up a follow up appointment regarding todays ED visit. You may follow-up with your primary care provider as needed.  Return to the Emergency Department if you are experiencing increasing/worsening pain, swelling, color change, fever, or worsening symptoms.  Fue un placer cuidarte!   Su radiografa fue negativa para fractura o dislocacin.  Se le enviar una receta de ibuprofeno y tylenol; tmelos segn las indicaciones. Asegrese de mantener la pierna elevada con hielo colocado en el rea por hasta 15 a 20 minutos a la vez. Se adjunta informacin para el ortopedista de [place]. Puede llamar y programar una cita de seguimiento con respecto a la visita al [employer] de Emergencias de [place]. Puede realizar un seguimiento con su proveedor de atencin primaria segn sea necesario.  Regrese al [employer] de Emergencias si experimenta dolor que aumenta o [place], hinchazn, cambio de color, fiebre o sntomas que [place].

## 2023-07-06 NOTE — ED Provider Notes (Signed)
EMERGENCY DEPARTMENT AT [facility] Provider Note   CSN: [id number] Arrival date & time: 07/06/23  1355     History  No chief complaint on file.   Amanda Spears is a 42 y.o. female who presents to the ED with concerns for left leg pain x 3 days. Notes that she fell initially 1.5 months ago that resulted in left knee pain.  She notes that during the fall she landed directly on her left knee.  Her pain worsened three days ago. Tried ibuprofen. Denies redness or swelling. Hasn't been evaluated for her symptoms.   The history is provided by the patient. A language interpreter was used (spanish).       Home Medications Prior to Admission medications   Medication Sig Start Date End Date Taking? Authorizing Provider  acetaminophen (TYLENOL) 500 MG tablet Take 1 tablet (500 mg total) by mouth every 6 (six) hours as needed. 07/06/23  Yes Makih Stefanko A, PA-C  diclofenac Sodium (VOLTAREN) 1 % GEL Apply 4 g topically 4 (four) times daily. 09/15/22   Gailen Shelter, PA  ibuprofen (ADVIL) 600 MG tablet Take 1 tablet (600 mg total) by mouth every 6 (six) hours as needed. 07/06/23   Julen Rubert A, PA-C  ipratropium (ATROVENT) 0.06 % nasal spray Place 2 sprays into both nostrils 4 (four) times daily. 11/04/14   Ozella Rocks, MD  methocarbamol (ROBAXIN) 500 MG tablet Take 1 tablet (500 mg total) by mouth 2 (two) times daily. 09/15/22   Gailen Shelter, PA  predniSONE (STERAPRED UNI-PAK 21 TAB) 10 MG (21) TBPK tablet 10mg  Tabs, 6 day taper. Use as directed 03/12/22   Pollyann Savoy, MD  Pseudoeph-Doxylamine-DM-APAP (NYQUIL PO) Take by mouth.    [provider]      Allergies    Patient has no known allergies.    Review of Systems   Review of Systems  All other systems reviewed and are negative.   Physical Exam Updated Vital Signs BP 122/83 (BP Location: Right Arm)   Pulse 79   Temp 97.9 F (36.6 C) (Oral)   Resp 16   Ht 5\' 1"  (1.549 m)   Wt  74.8 kg   SpO2 98%   BMI 31.18 kg/m  Physical Exam Vitals and nursing note reviewed.  Constitutional:      General: She is not in acute distress.    Appearance: Normal appearance.  Eyes:     General: No scleral icterus.    Extraocular Movements: Extraocular movements intact.  Cardiovascular:     Rate and Rhythm: Normal rate.  Pulmonary:     Effort: Pulmonary effort is normal. No respiratory distress.  Abdominal:     Palpations: Abdomen is soft. There is no mass.     Tenderness: There is no abdominal tenderness.  Musculoskeletal:        General: Normal range of motion.     Cervical back: Neck supple.     Comments: Tenderness to palpation noted diffusely throughout left knee.  No overlying erythema, palpable deformity, edema noted.  Full flexion extension of left knee against resistance without difficulty.  Tenderness to palpation noted to left popliteal region.  No tenderness to palpation noted to remainder of left leg.  Skin:    General: Skin is warm and dry.     Findings: No rash.  Neurological:     Mental Status: She is alert.     Sensory: Sensation is intact.     Motor: Motor function  is intact.  Psychiatric:        Behavior: Behavior normal.     ED Results / Procedures / Treatments   Labs (all labs ordered are listed, but only abnormal results are displayed) Labs Reviewed - No data to display  EKG None  Radiology DG Knee Complete 4 Views Left  Result Date: 07/06/2023 CLINICAL DATA:  fall EXAM: LEFT KNEE - COMPLETE 4+ VIEW COMPARISON:  None Available. FINDINGS: No acute fracture or dislocation. No aggressive osseous lesion. The knee joint appears within normal limits. No significant arthritis. No knee effusion or focal soft tissue swelling. No radiopaque foreign bodies. IMPRESSION: 1. Negative. Electronically Signed   By: Jules Schick M.D.   On: 07/06/2023 14:51    Procedures Procedures    Medications Ordered in ED Medications  acetaminophen (TYLENOL) tablet  650 mg (has no administration in time range)    ED Course/ Medical Decision Making/ A&P                                 Medical Decision Making Amount and/or Complexity of Data Reviewed Radiology: ordered.   Patient with left knee pain onset 1.5 months ago after landing directly on her knee after a fall. Vital signs patient afebrile. On exam, patient with Tenderness to palpation noted diffusely throughout left knee.  No overlying erythema, palpable deformity, edema noted.  Full flexion extension of left knee against resistance without difficulty.  Tenderness to palpation noted to left popliteal region.  No tenderness to palpation noted to remainder of left leg. Differential diagnosis includes effusion, fracture, dislocation, sprain.  Imaging: I ordered imaging studies including left knee xray I independently visualized and interpreted imaging which showed: no acute findings I agree with the radiologist interpretation  Medications:  I ordered medication including tylenol for symptom management  I have reviewed the patients home medicines and have made adjustments as needed    Disposition: Presenting suspicious for likely sprain of left knee.  Doubt concerns at this time for fracture, dislocation, effusion.  X-ray imaging without concerns for osteoarthritis.  Patient afebrile, no overlying skin changes, low suspicion at this time for septic arthritis. After consideration of the diagnostic results and the patients response to treatment, I feel that the patient would benefit from Discharge home.  Offered crutches and a work note today, patient declines at this time.  Patient provided with information for orthopedics for follow-up regarding today's ED visit.  Work note provided. Supportive care measures and strict return precautions discussed with patient at bedside. Pt acknowledges and verbalizes understanding. Pt appears safe for discharge. Follow up as indicated in discharge paperwork.     This chart was dictated using voice recognition software, Dragon. Despite the best efforts of this provider to proofread and correct errors, errors may still occur which can change documentation meaning.   Final Clinical Impression(s) / ED Diagnoses Final diagnoses:  Acute pain of left knee    Rx / DC Orders ED Discharge Orders          Ordered    acetaminophen (TYLENOL) 500 MG tablet  Every 6 hours PRN        07/06/23 1547    ibuprofen (ADVIL) 600 MG tablet  Every 6 hours PRN        07/06/23 1547              Terez Freimark A, PA-C 07/06/23 1552    Tegeler, Canary Brim,  MD 07/06/23 5329

## 2023-07-06 NOTE — ED Triage Notes (Signed)
Spanish Interpretor Marcelline Deist #[id number]  Patient c/o a fall a month ago, resulting in left knee pain that went away, and reports the pain came back 3 days ago.

## 2023-11-16 ENCOUNTER — Inpatient Hospital Stay (HOSPITAL_COMMUNITY): Payer: Self-pay

## 2023-11-16 ENCOUNTER — Inpatient Hospital Stay (HOSPITAL_COMMUNITY)
Admission: AD | Admit: 2023-11-16 | Discharge: 2023-11-16 | Disposition: A | Discharge status: Home / Self Care | Payer: Self-pay | Attending: Obstetrics and Gynecology | Admitting: Obstetrics and Gynecology

## 2023-11-16 ENCOUNTER — Encounter (HOSPITAL_COMMUNITY): Payer: Self-pay | Admitting: *Deleted

## 2023-11-16 DIAGNOSIS — Z3A01 Less than 8 weeks gestation of pregnancy: Secondary | ICD-10-CM | POA: Insufficient documentation

## 2023-11-16 DIAGNOSIS — O26891 Other specified pregnancy related conditions, first trimester: Secondary | ICD-10-CM | POA: Insufficient documentation

## 2023-11-16 DIAGNOSIS — O09521 Supervision of elderly multigravida, first trimester: Secondary | ICD-10-CM | POA: Insufficient documentation

## 2023-11-16 DIAGNOSIS — O209 Hemorrhage in early pregnancy, unspecified: Secondary | ICD-10-CM | POA: Insufficient documentation

## 2023-11-16 DIAGNOSIS — R109 Unspecified abdominal pain: Secondary | ICD-10-CM | POA: Insufficient documentation

## 2023-11-16 DIAGNOSIS — O3680X0 Pregnancy with inconclusive fetal viability, not applicable or unspecified: Secondary | ICD-10-CM | POA: Insufficient documentation

## 2023-11-16 LAB — COMPREHENSIVE METABOLIC PANEL
ALT: 53 U/L — ABNORMAL HIGH (ref 0–44)
AST: 58 U/L — ABNORMAL HIGH (ref 15–41)
Albumin: 3.9 g/dL (ref 3.5–5.0)
Alkaline Phosphatase: 70 U/L (ref 38–126)
Anion gap: 11 (ref 5–15)
BUN: 12 mg/dL (ref 6–20)
CO2: 22 mmol/L (ref 22–32)
Calcium: 9 mg/dL (ref 8.9–10.3)
Chloride: 105 mmol/L (ref 98–111)
Creatinine, Ser: 0.6 mg/dL (ref 0.44–1.00)
GFR, Estimated: >60 mL/min (ref >60)
Glucose, Bld: 127 mg/dL — ABNORMAL HIGH (ref 70–99)
Potassium: 3.7 mmol/L (ref 3.5–5.1)
Sodium: 138 mmol/L (ref 135–145)
Total Bilirubin: 0.9 mg/dL (ref <1.2)
Total Protein: 7.2 g/dL (ref 6.5–8.1)

## 2023-11-16 LAB — URINALYSIS, ROUTINE W REFLEX MICROSCOPIC
Bilirubin Urine: NEGATIVE
Glucose, UA: NEGATIVE mg/dL
Ketones, ur: NEGATIVE mg/dL
Leukocytes,Ua: NEGATIVE
Nitrite: NEGATIVE
Protein, ur: NEGATIVE mg/dL
Specific Gravity, Urine: 1.005 (ref 1.005–1.030)
pH: 6 (ref 5.0–8.0)

## 2023-11-16 LAB — CBC
HCT: 40.5 % (ref 36.0–46.0)
Hemoglobin: 14 g/dL (ref 12.0–15.0)
MCH: 30.2 pg (ref 26.0–34.0)
MCHC: 34.6 g/dL (ref 30.0–36.0)
MCV: 87.3 fL (ref 80.0–100.0)
Platelets: 368 10*3/uL (ref 150–400)
RBC: 4.64 MIL/uL (ref 3.87–5.11)
RDW: 13.3 % (ref 11.5–15.5)
WBC: 10 10*3/uL (ref 4.0–10.5)
nRBC: 0 % (ref 0.0–0.2)

## 2023-11-16 LAB — WET PREP, GENITAL
Sperm: NONE SEEN
Trich, Wet Prep: NONE SEEN
WBC, Wet Prep HPF POC: >=10 — AB (ref <10)
Yeast Wet Prep HPF POC: NONE SEEN

## 2023-11-16 LAB — HCG, QUANTITATIVE, PREGNANCY: hCG, Beta Chain, Quant, S: 277 m[IU]/mL — ABNORMAL HIGH (ref <5)

## 2023-11-16 LAB — POCT PREGNANCY, URINE: Preg Test, Ur: POSITIVE — AB

## 2023-11-16 MED ORDER — ACETAMINOPHEN 500 MG PO TABS
1000.0000 mg | ORAL_TABLET | Freq: Once | ORAL | Status: AC
  Administered 2023-11-16: 1000 mg via ORAL
  Filled 2023-11-16: qty 2
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
## 2023-11-17 ENCOUNTER — Other Ambulatory Visit: Payer: Self-pay

## 2023-11-17 ENCOUNTER — Inpatient Hospital Stay (HOSPITAL_COMMUNITY)
Admission: AD | Admit: 2023-11-17 | Discharge: 2023-11-17 | Disposition: A | Discharge status: Home / Self Care | Payer: Self-pay | Attending: Obstetrics and Gynecology | Admitting: Obstetrics and Gynecology

## 2023-11-17 DIAGNOSIS — O3680X0 Pregnancy with inconclusive fetal viability, not applicable or unspecified: Secondary | ICD-10-CM | POA: Insufficient documentation

## 2023-11-17 DIAGNOSIS — Z3A01 Less than 8 weeks gestation of pregnancy: Secondary | ICD-10-CM | POA: Insufficient documentation

## 2023-11-17 DIAGNOSIS — O209 Hemorrhage in early pregnancy, unspecified: Secondary | ICD-10-CM | POA: Insufficient documentation

## 2023-11-17 DIAGNOSIS — R109 Unspecified abdominal pain: Secondary | ICD-10-CM | POA: Insufficient documentation

## 2023-11-17 LAB — URINALYSIS, ROUTINE W REFLEX MICROSCOPIC
Bilirubin Urine: NEGATIVE
Glucose, UA: >=500 mg/dL — AB
Ketones, ur: NEGATIVE mg/dL
Leukocytes,Ua: NEGATIVE
Nitrite: NEGATIVE
Protein, ur: 100 mg/dL — AB
RBC / HPF: >50 RBC/hpf (ref 0–5)
Specific Gravity, Urine: 1.015 (ref 1.005–1.030)
pH: 6 (ref 5.0–8.0)

## 2023-11-17 LAB — CBC
HCT: 42.1 % (ref 36.0–46.0)
Hemoglobin: 14.1 g/dL (ref 12.0–15.0)
MCH: 29.7 pg (ref 26.0–34.0)
MCHC: 33.5 g/dL (ref 30.0–36.0)
MCV: 88.8 fL (ref 80.0–100.0)
Platelets: 377 10*3/uL (ref 150–400)
RBC: 4.74 MIL/uL (ref 3.87–5.11)
RDW: 13.3 % (ref 11.5–15.5)
WBC: 7.4 10*3/uL (ref 4.0–10.5)
nRBC: 0 % (ref 0.0–0.2)

## 2023-11-17 LAB — COMPREHENSIVE METABOLIC PANEL
ALT: 47 U/L — ABNORMAL HIGH (ref 0–44)
AST: 35 U/L (ref 15–41)
Albumin: 3.8 g/dL (ref 3.5–5.0)
Alkaline Phosphatase: 79 U/L (ref 38–126)
Anion gap: 9 (ref 5–15)
BUN: 6 mg/dL (ref 6–20)
CO2: 24 mmol/L (ref 22–32)
Calcium: 9.3 mg/dL (ref 8.9–10.3)
Chloride: 105 mmol/L (ref 98–111)
Creatinine, Ser: 0.56 mg/dL (ref 0.44–1.00)
GFR, Estimated: >60 mL/min (ref >60)
Glucose, Bld: 164 mg/dL — ABNORMAL HIGH (ref 70–99)
Potassium: 3.8 mmol/L (ref 3.5–5.1)
Sodium: 138 mmol/L (ref 135–145)
Total Bilirubin: 0.4 mg/dL (ref <1.2)
Total Protein: 7 g/dL (ref 6.5–8.1)

## 2023-11-17 LAB — GC/CHLAMYDIA PROBE AMP (~~LOC~~) NOT AT ARMC
Chlamydia: NEGATIVE
Comment: NEGATIVE
Comment: NORMAL
Neisseria Gonorrhea: NEGATIVE

## 2023-11-17 MED ORDER — ACETAMINOPHEN 500 MG PO TABS
1000.0000 mg | ORAL_TABLET | Freq: Once | ORAL | Status: AC
  Administered 2023-11-17: 1000 mg via ORAL
  Filled 2023-11-17: qty 2
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
## 2023-11-17 NOTE — Discharge Instructions (Signed)
Please return to MAU 11/18/23 for a repeat of your pregnancy hormone levels.   Regrese a MAU el 14/12/24 para repetir sus niveles de hormonas del embarazo.
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
## 2023-11-17 NOTE — MAU Note (Signed)
.  Amanda Spears is a 42 y.o. at [redacted]w[redacted]d here in MAU reporting: was here last night - possible miscarriage. Today started having heavy VB - saw large blood clot come out when she used the restroom and has continued to have moderate bleeding. Also reporting lower abdominal cramping. Took tylenol this morning. Would like medication here for pain.   Onset of complaint: 1700 Pain score: 8 Vitals:   11/17/23 2047  BP: 128/78  Pulse: 93  Resp: 17  Temp: 98.4 F (36.9 C)  SpO2: 98%     FHT:NA  Lab orders placed from triage:  UA

## 2023-11-18 ENCOUNTER — Inpatient Hospital Stay (HOSPITAL_COMMUNITY)
Admission: RE | Admit: 2023-11-18 | Discharge: 2023-11-18 | Disposition: A | Discharge status: Home / Self Care | Payer: Self-pay | Source: Ambulatory Visit | Attending: Obstetrics and Gynecology | Admitting: Obstetrics and Gynecology

## 2023-11-18 ENCOUNTER — Inpatient Hospital Stay (HOSPITAL_COMMUNITY)
Admission: AD | Admit: 2023-11-18 | Discharge: 2023-11-18 | Disposition: A | Discharge status: Home / Self Care | Payer: Self-pay | Source: Ambulatory Visit | Attending: Obstetrics and Gynecology | Admitting: Obstetrics and Gynecology

## 2023-11-18 DIAGNOSIS — O209 Hemorrhage in early pregnancy, unspecified: Secondary | ICD-10-CM | POA: Insufficient documentation

## 2023-11-18 DIAGNOSIS — O3680X0 Pregnancy with inconclusive fetal viability, not applicable or unspecified: Secondary | ICD-10-CM | POA: Insufficient documentation

## 2023-11-18 DIAGNOSIS — Z3A01 Less than 8 weeks gestation of pregnancy: Secondary | ICD-10-CM | POA: Insufficient documentation

## 2023-11-18 LAB — HCG, QUANTITATIVE, PREGNANCY: hCG, Beta Chain, Quant, S: 134 m[IU]/mL — ABNORMAL HIGH (ref <5)

## 2023-11-18 NOTE — MAU Provider Note (Signed)
  History     CSN: [id number]  Arrival date and time: 11/18/23 1853   Event Date/Time   First Provider Initiated Contact with Patient 11/18/2023  7:01 PM   Chief Complaint  Patient presents with   Follow-up    HPI  Amanda Spears is a 42 y.o. G1P0 at [redacted]w[redacted]d who presents to the MAU for repeat quant. She was seen on 12/12 fro vaginal bleeding in early pregnancy, a quant at the time was 277. She returned yesterday for continued pain, which was responsive to PO medication. She reports since her initial presentation, her bleeding and pain have both decreased. She denies fevers, chills, lightheadedness.   Past Medical History:  Diagnosis Date   Hypertension     No past surgical history on file.  No family history on file.  Social History   Tobacco Use   Smoking status: Never   Smokeless tobacco: Never  Substance Use Topics   Alcohol use: No   Drug use: No    Allergies: No Known Allergies  No medications prior to admission.    ROS reviewed and pertinent positives and negatives as documented in HPI.  Physical Exam   Blood pressure 115/77, pulse 76, temperature 98.9 F (37.2 C), temperature source Oral, resp. rate 16, height 5\' 2"  (1.575 m), weight 72.2 kg, last menstrual period 10/03/2023, SpO2 95%.  Physical Exam Constitutional:      General: She is not in acute distress.    Appearance: Normal appearance. She is not ill-appearing.  HENT:     Head: Normocephalic and atraumatic.  Cardiovascular:     Rate and Rhythm: Normal rate.  Pulmonary:     Effort: Pulmonary effort is normal.     Breath sounds: Normal breath sounds.  Musculoskeletal:        General: Normal range of motion.  Skin:    General: Skin is warm and dry.     Findings: No rash.  Neurological:     General: No focal deficit present.     Mental Status: She is alert and oriented to person, place, and time.     MAU Course  Procedures  MDM 42 y.o. G1P0 at [redacted]w[redacted]d presenting for f/up hCG. Quant  today downtrending -- decreased from 277 on 12/12 to  134 today. Discussed likely SAB vs anembryonic pregnancy. Expectant management as well as return precautions discussed. Pt to return in 1 week for repeat hCG or sooner prn.   Assessment and Plan  Vaginal bleeding in pregnancy, first trimester - Plan: Discharge patient hCG downtrending, suspect SAB  Return precautions/ectopic precautions addressed Stable for d/c  Sundra Aland, MD OB Fellow, Faculty [facility], [facility] for [facility]  11/18/2023, 8:57 PM

## 2023-11-18 NOTE — MAU Note (Signed)
..  Amanda Spears is a 42 y.o. at [redacted]w[redacted]d here in MAU reporting: here for repeat HCG levels. Has less pain from yesterday and is having the same amount of bleeding.   Pain score:4 /10 Vitals:   11/18/23 1944  BP: 111/76  Pulse: 76  Resp: 18  Temp: 98.9 F (37.2 C)  SpO2: 100%     FHT:n/a

## 2023-11-26 ENCOUNTER — Telehealth: Payer: Self-pay | Admitting: Nurse Practitioner

## 2023-11-26 ENCOUNTER — Inpatient Hospital Stay (HOSPITAL_COMMUNITY)
Admit: 2023-11-26 | Discharge: 2023-11-26 | Disposition: A | Discharge status: Home / Self Care | Payer: Self-pay | Attending: Family Medicine | Admitting: Family Medicine

## 2023-11-26 ENCOUNTER — Inpatient Hospital Stay (HOSPITAL_COMMUNITY)
Admission: AD | Admit: 2023-11-26 | Discharge: 2023-11-26 | Disposition: A | Discharge status: Home / Self Care | Payer: Self-pay | Attending: Obstetrics & Gynecology | Admitting: Obstetrics & Gynecology

## 2023-11-26 DIAGNOSIS — O021 Missed abortion: Secondary | ICD-10-CM

## 2023-11-26 DIAGNOSIS — R0989 Other specified symptoms and signs involving the circulatory and respiratory systems: Secondary | ICD-10-CM

## 2023-11-26 LAB — RESP PANEL BY RT-PCR (RSV, FLU A&B, COVID)  RVPGX2
Influenza A by PCR: NEGATIVE
Influenza B by PCR: NEGATIVE
Resp Syncytial Virus by PCR: NEGATIVE
SARS Coronavirus 2 by RT PCR: NEGATIVE

## 2023-11-26 LAB — HCG, QUANTITATIVE, PREGNANCY: hCG, Beta Chain, Quant, S: 7 m[IU]/mL — ABNORMAL HIGH (ref <5)

## 2023-11-26 NOTE — Discharge Instructions (Signed)
Return to the MAU or office if  increased bleeding, pain, fever, or worsening s/s

## 2023-11-26 NOTE — Telephone Encounter (Signed)
Called patient to review Negative results from Flu/COVID swab sent today at MAU. Patient will continue with OTC meds.

## 2023-11-26 NOTE — MAU Provider Note (Addendum)
History   Chief Complaint:  repeat hCG   Amanda Spears is  42 y.o. G1P0 Patient's last menstrual period was 10/03/2023.Marland Kitchen Patient is here for follow up of quantitative HCG and ongoing surveillance of pregnancy status.   She is [redacted]w[redacted]d weeks gestation by LMP. Patient reports that she is no longer bleeding. Denies any pelvic pain or cramping. Last hCG quant was 134 on 11/18/23   Since her last visit, the patient is with new complaint.  Today she is complaining of mild body aches and a dry non productive cough.  Denies any fever or chills. Reports only taking Tylenol OTC with mild resolve.  General ROS:   Review of Systems  Constitutional: Negative.   HENT:  Negative for congestion.   Respiratory:  Positive for cough. Negative for hemoptysis, sputum production, shortness of breath and wheezing.   Musculoskeletal:  Positive for myalgias.       Patient reports mild body aches  Neurological:  Negative for headaches.     Her previous Quantitative HCG values are: 134   Physical Exam  Blood pressure 110/74, pulse 77, temperature 98.1 F (36.7 C), temperature source Oral, resp. rate 17, height 5\' 2"  (1.575 m), weight 72.1 kg, last menstrual period 10/03/2023, SpO2 99%.  Physical Exam Constitutional:      General: She is not in acute distress.    Appearance: Normal appearance.  HENT:     Nose: No congestion or rhinorrhea.  Cardiovascular:     Rate and Rhythm: Normal rate and regular rhythm.  Pulmonary:     Effort: Pulmonary effort is normal.     Breath sounds: Normal breath sounds.  Neurological:     Mental Status: She is alert.  Psychiatric:        Mood and Affect: Mood normal.        Behavior: Behavior normal.       Labs: HCG quant today is 7 134-> 7 ( Negative)     Ultrasound Studies:   US OB LESS THAN 14 WEEKS WITH OB TRANSVAGINAL Result Date: 11/16/2023 CLINICAL DATA:  Vaginal bleeding during 1st trimester pregnancy. EXAM: OBSTETRIC <14 WK [place] AND TRANSVAGINAL OB  US TECHNIQUE: Both transabdominal and transvaginal ultrasound examinations were performed for complete evaluation of the gestation as well as the maternal uterus, adnexal regions, and pelvic cul-de-sac. Transvaginal technique was performed to assess early pregnancy. COMPARISON:  None Available. FINDINGS: Intrauterine gestational sac: None Maternal uterus/adnexae: Endometrial thickness measures 8 mm. Both ovaries are normal in appearance. No mass or abnormal free fluid identified. IMPRESSION: Pregnancy of unknown anatomic location (no intrauterine gestational sac or adnexal mass identified). Differential diagnosis includes recent spontaneous abortion, IUP too early to visualize, and non-visualized ectopic pregnancy. Recommend followup of beta-hCG levels, and follow up US as warranted clinically. Electronically Signed   By: Danae Orleans M.D.   On: 11/16/2023 17:45   MDM  - Physical Exam with ROS - Respiratory swab sent - OTC medication d/w patient - Labs reviewed and interpreted   Assessment:  [redacted]w[redacted]d weeks gestation here for ongoing surveillance of MAB - Complete MAB  - Viral URI  Plan: - Respiratory swab sent ( Will call patient with results) - OTC meds for cough/ cold ( Tylenol, cold/flu, Robitussin)  - Return if s/s do not improve or worsen  The patient is instructed that no further Hcg are required and OB f/u as scheduled  Colman Cater, NP  11/26/2023  3:40 PM   This note was completed and signed by Amanda Spears  Amanda Granzow NP. I have reviewed and co-signed.   Amanda Lope, NP 11/26/2023 3:38 PM  .

## 2023-11-26 NOTE — MAU Note (Signed)
.  Amanda Spears is a 42 y.o. at [redacted]w[redacted]d here in MAU reporting: here for repeat hCG. Denies VB or abnormal discharge. Does report generalized body aches (6/10) and a dry cough for the past two days. Has not been around anyone sick. Tylenol is helping a little - last dose at 0000.   Onset of complaint: 2 days ago Pain score: 6/10 Vitals:   11/26/23 0823  BP: 114/70  Pulse: 82  Resp: 17  Temp: 98.1 F (36.7 C)  SpO2: 99%     FHT: Not indicated Lab orders placed from triage: hCG

## 2023-12-12 ENCOUNTER — Emergency Department (HOSPITAL_BASED_OUTPATIENT_CLINIC_OR_DEPARTMENT_OTHER): Payer: Self-pay

## 2023-12-12 ENCOUNTER — Other Ambulatory Visit: Payer: Self-pay

## 2023-12-12 ENCOUNTER — Encounter (HOSPITAL_BASED_OUTPATIENT_CLINIC_OR_DEPARTMENT_OTHER): Payer: Self-pay | Admitting: Emergency Medicine

## 2023-12-12 ENCOUNTER — Emergency Department (HOSPITAL_BASED_OUTPATIENT_CLINIC_OR_DEPARTMENT_OTHER)
Admission: EM | Admit: 2023-12-12 | Discharge: 2023-12-12 | Disposition: A | Discharge status: Home / Self Care | Payer: Self-pay | Attending: Emergency Medicine | Admitting: Emergency Medicine

## 2023-12-12 DIAGNOSIS — M545 Low back pain, unspecified: Secondary | ICD-10-CM | POA: Insufficient documentation

## 2023-12-12 DIAGNOSIS — R350 Frequency of micturition: Secondary | ICD-10-CM | POA: Insufficient documentation

## 2023-12-12 DIAGNOSIS — R109 Unspecified abdominal pain: Secondary | ICD-10-CM | POA: Insufficient documentation

## 2023-12-12 LAB — URINALYSIS, ROUTINE W REFLEX MICROSCOPIC
Bilirubin Urine: NEGATIVE
Glucose, UA: NEGATIVE mg/dL
Hgb urine dipstick: NEGATIVE
Ketones, ur: NEGATIVE mg/dL
Leukocytes,Ua: NEGATIVE
Nitrite: NEGATIVE
Protein, ur: NEGATIVE mg/dL
Specific Gravity, Urine: 1.021 (ref 1.005–1.030)
pH: 6 (ref 5.0–8.0)

## 2023-12-12 LAB — HCG, QUANTITATIVE, PREGNANCY: hCG, Beta Chain, Quant, S: <1 m[IU]/mL (ref <5)

## 2023-12-12 LAB — PREGNANCY, URINE: Preg Test, Ur: NEGATIVE

## 2023-12-12 MED ORDER — OXYCODONE HCL 5 MG PO TABS
5.0000 mg | ORAL_TABLET | Freq: Once | ORAL | Status: AC
  Administered 2023-12-12: 5 mg via ORAL
  Filled 2023-12-12: qty 1

## 2023-12-12 MED ORDER — ACETAMINOPHEN 500 MG PO TABS
1000.0000 mg | ORAL_TABLET | Freq: Once | ORAL | Status: AC
  Administered 2023-12-12: 1000 mg via ORAL
  Filled 2023-12-12: qty 2

## 2023-12-12 NOTE — Discharge Instructions (Addendum)
 Your back pain is most likely due to a muscular strain.  There is been a lot of research on back pain, unfortunately the only thing that seems to really help is Tylenol  and ibuprofen .  Relative rest is also important to not lift greater than 10 pounds bending or twisting at the waist.  Please follow-up with your family physician.  The other thing that really seems to benefit patients is physical therapy which your doctor may send you for.  Please return to the emergency department for new numbness or weakness to your arms or legs. Difficulty with urinating or urinating or pooping on yourself.  Also if you cannot feel toilet paper when you wipe or get a fever.   Take 4 over the counter ibuprofen  tablets 3 times a day or 2 over-the-counter naproxen  tablets twice a day for pain. Also take tylenol  1000mg (2 extra strength) four times a day.     848 / 5,000 Lo ms probable es que su dolor de espalda se deba a una distensin muscular. Se han realizado muchas investigaciones sobre el dolor de espalda, [place] lamentablemente lo nico que parece ayudar realmente es el Tylenol  y el ibuprofeno. Tambin es importante el descanso relativo para no levantar ms de 10 libras agachndose o girando la cintura. Por favor, consulte con su mdico de familia. La otra cosa que realmente parece beneficiar a los pacientes es la fisioterapia, a la que su mdico puede derivarlo. Por favor, vuelva al departamento de emergencias si nota entumecimiento o debilidad en los brazos o las piernas. Dificultad para orinar o para orinar o genuine parts s mismo. Tambin si no siente el papel higinico cuando se limpia o tiene fiebre.  Tome 4 tabletas de ibuprofeno de venta libre 3 veces al da o 2 tabletas de naproxeno de venta [address] veces al da para chief technology officer. Tambin tome Tylenol  1000 mg (2 de mayor potencia) cuatro veces al [facility] [employer].  Consulte con la clnica de la mujer.

## 2023-12-12 NOTE — ED Provider Notes (Addendum)
 [place] EMERGENCY DEPARTMENT AT [facility] Provider Note   CSN: [id number] Arrival date & time: 12/12/23  1006     History  Chief Complaint  Patient presents with   Abdominal Pain    Amanda Spears is a 43 y.o. female.  43 yo F with a chief complaints of left flank pain.  Worse with turning over on her side at night and lying back flat in bed.  Denies fevers denies vomiting.  Has has increased urinary frequency but denies dysuria or hesitancy.  Denies diarrhea.  Denies vaginal bleeding or discharge.  She tells me that she was recently seen in the women[facility] clinic and was thought to have miscarried.  She denies any significant bleeding or passage of tissue.  A language interpreter was used.  Abdominal Pain      Home Medications Prior to Admission medications   Medication Sig Start Date End Date Taking? Authorizing Provider  acetaminophen  (TYLENOL ) 325 MG tablet Take 650 mg by mouth every 6 (six) hours as needed.    [provider]  Prenatal Vit-Fe Fumarate-FA (PRENATAL MULTIVITAMIN) TABS tablet Take 1 tablet by mouth daily at 12 noon.    [provider]      Allergies    Patient has no known allergies.    Review of Systems   Review of Systems  Gastrointestinal:  Positive for abdominal pain.    Physical Exam Updated Vital Signs BP 102/66 (BP Location: Right Arm)   Pulse 63   Temp 98.3 F (36.8 C) (Oral)   Resp 14   Ht 5' 2 (1.575 m)   Wt 72 kg   LMP 10/03/2023   SpO2 100%   BMI 29.03 kg/m  Physical Exam Vitals and nursing note reviewed.  Constitutional:      General: She is not in acute distress.    Appearance: She is well-developed. She is not diaphoretic.  HENT:     Head: Normocephalic and atraumatic.  Eyes:     Pupils: Pupils are equal, round, and reactive to light.  Cardiovascular:     Rate and Rhythm: Normal rate and regular rhythm.     Heart sounds: No murmur heard.    No friction rub. No gallop.  Pulmonary:      Effort: Pulmonary effort is normal.     Breath sounds: No wheezing or rales.  Abdominal:     General: There is no distension.     Palpations: Abdomen is soft.     Tenderness: There is no abdominal tenderness.     Comments: Benign abdominal exam.  She does have some mild left lower back muscle spasm and tenderness.  Musculoskeletal:        General: No tenderness.     Cervical back: Normal range of motion and neck supple.  Skin:    General: Skin is warm and dry.  Neurological:     Mental Status: She is alert and oriented to person, place, and time.  Psychiatric:        Behavior: Behavior normal.     ED Results / Procedures / Treatments   Labs (all labs ordered are listed, but only abnormal results are displayed) Labs Reviewed  PREGNANCY, URINE  URINALYSIS, ROUTINE W REFLEX MICROSCOPIC  HCG, QUANTITATIVE, PREGNANCY    EKG None  Radiology US  PELVIC COMPLETE WITH TRANSVAGINAL Result Date: 12/12/2023 CLINICAL DATA:  Possible miscarriage for evaluation of retained products EXAM: TRANSABDOMINAL AND TRANSVAGINAL ULTRASOUND OF PELVIS TECHNIQUE: Both transabdominal and transvaginal ultrasound examinations of the pelvis  were performed. Transabdominal technique was performed for global imaging of the pelvis including uterus, ovaries, adnexal regions, and pelvic cul-de-sac. It was necessary to proceed with endovaginal exam following the transabdominal exam to visualize the pelvic structures. COMPARISON:  Obstetric ultrasound examination dated 11/16/2023 FINDINGS: Uterus Measurements: 7.8 cm in sagittal dimension. No fibroids or other mass visualized. Nabothian cysts. Endometrium Thickness: 11 mm.  No focal abnormality visualized. Right ovary Measurements: 3.6 x 2.5 x 2.0 cm = volume: 9.6 mL. Normal appearance. No adnexal mass. Left ovary Measurements: 3.0 x 2.6 x 1.6 cm = volume: 6.5 mL. Normal appearance. No adnexal mass. Other findings:  No abnormal free fluid. IMPRESSION: Borderline  endometrial thickness. No definite evidence of retained products of conception. Recommend correlation with beta HCG and consider short-term follow-up pelvic ultrasound examination as clinically indicated. Electronically Signed   By: Limin  Xu M.D.   On: 12/12/2023 13:01    Procedures Procedures    Medications Ordered in ED Medications  acetaminophen  (TYLENOL ) tablet 1,000 mg (1,000 mg Oral Given 12/12/23 1149)  oxyCODONE  (Oxy IR/ROXICODONE ) immediate release tablet 5 mg (5 mg Oral Given 12/12/23 1149)    ED Course/ Medical Decision Making/ A&P                                 Medical Decision Making Amount and/or Complexity of Data Reviewed Labs: ordered. Radiology: ordered.  Risk OTC drugs. Prescription drug management.   43 yo F with a chief complaints of left flank pain.  Sounds musculoskeletal by history and physical.  On my record review however this is complicated by the fact that the patient had recently miscarried.  No obvious passage of tissue.  She denies fevers or vaginal discharge though endometritis would be on my differential.  Will recheck hCG though was pretty much 0 on last check.  Pelvic ultrasound.  Urine pregnancy test negative.  Pelvic ultrasound without obvious retained products of conception.  HCG <1.  Will have her follow-up with GYN in the office.  Treat as musculoskeletal back pain.  2:48 PM:  I have discussed the diagnosis/risks/treatment options with the patient.  Evaluation and diagnostic testing in the emergency department does not suggest an emergent condition requiring admission or immediate intervention beyond what has been performed at this time.  They will follow up with PCP, GYN. We also discussed returning to the ED immediately if new or worsening sx occur. We discussed the sx which are most concerning (e.g., sudden worsening pain, fever, inability to tolerate by mouth) that necessitate immediate return. Medications administered to the patient during  their visit and any new prescriptions provided to the patient are listed below.  Medications given during this visit Medications  acetaminophen  (TYLENOL ) tablet 1,000 mg (1,000 mg Oral Given 12/12/23 1149)  oxyCODONE  (Oxy IR/ROXICODONE ) immediate release tablet 5 mg (5 mg Oral Given 12/12/23 1149)     The patient appears reasonably screen and/or stabilized for discharge and I doubt any other medical condition or other [facility] requiring further screening, evaluation, or treatment in the ED at this time prior to discharge.          Final Clinical Impression(s) / ED Diagnoses Final diagnoses:  Acute left-sided low back pain without sciatica    Rx / DC Orders ED Discharge Orders     None            Emil Share, DO 12/12/23 1454

## 2023-12-12 NOTE — ED Triage Notes (Signed)
 Interpreter [phone]   C/o body aches, weakness, and  lower abd pain x 3 days. Was seen at PCP for possible miscarriage on 12/22. Unsure if she is still pregnant. Was approx 3 weeks at the time of the appt. Denies any bleeding at the moment, does endorse a hot womb

## 2023-12-12 NOTE — ED Notes (Signed)
 Pt given discharge instructions and reviewed via translator. Opportunities given for questions. Pt verbalizes understanding. Jillyn Hidden, RN

## 2024-09-27 ENCOUNTER — Emergency Department (HOSPITAL_BASED_OUTPATIENT_CLINIC_OR_DEPARTMENT_OTHER)
Admission: EM | Admit: 2024-09-27 | Discharge: 2024-09-27 | Disposition: A | Discharge status: Home / Self Care | Payer: Self-pay | Attending: Emergency Medicine | Admitting: Emergency Medicine

## 2024-09-27 ENCOUNTER — Encounter (HOSPITAL_BASED_OUTPATIENT_CLINIC_OR_DEPARTMENT_OTHER): Payer: Self-pay | Admitting: *Deleted

## 2024-09-27 ENCOUNTER — Other Ambulatory Visit: Payer: Self-pay

## 2024-09-27 DIAGNOSIS — N644 Mastodynia: Secondary | ICD-10-CM | POA: Insufficient documentation

## 2024-09-27 MED ORDER — IBUPROFEN 800 MG PO TABS
800.0000 mg | ORAL_TABLET | Freq: Once | ORAL | Status: AC
  Administered 2024-09-27: 800 mg via ORAL
  Filled 2024-09-27: qty 1

## 2024-09-27 NOTE — ED Triage Notes (Signed)
 Interpreter Tawni [phone] used for triage.   Pt to ED reporting left sided breast pain x 3 days.

## 2024-09-27 NOTE — ED Provider Notes (Signed)
  [facility] AT [facility] Provider Note   CSN: [id number] Arrival date & time: 09/27/24  1617     Patient presents with: Breast Pain  HPI Amanda Spears is a 43 y.o. female presenting for left sided breast pain.  Started 3 days ago.  She reports that she feels there is a lump about the medial upper aspect of her left breast.  It is intermittently painful at times and she states it feels like it moves.  Denies any abnormal bleeding or discharge from the breast or nipple.  Denies any discoloration of the skin or notable swelling in the breast.  Denies fever.  Denies chest pain and shortness of breath.   HPI     Prior to Admission medications   Medication Sig Start Date End Date Taking? Authorizing Provider  acetaminophen  (TYLENOL ) 325 MG tablet Take 650 mg by mouth every 6 (six) hours as needed.    [provider]  Prenatal Vit-Fe Fumarate-FA (PRENATAL MULTIVITAMIN) TABS tablet Take 1 tablet by mouth daily at 12 noon.    [provider]    Allergies: Patient has no known allergies.    Review of Systems See HPI  Updated Vital Signs BP 130/74 (BP Location: Right Arm)   Pulse (!) 104   Temp 98.6 F (37 C)   Resp 17   LMP 10/03/2023   SpO2 98%   Physical Exam Exam conducted with a chaperone present.  Constitutional:      Appearance: Normal appearance.  HENT:     Head: Normocephalic.     Nose: Nose normal.  Eyes:     Conjunctiva/sclera: Conjunctivae normal.  Pulmonary:     Effort: Pulmonary effort is normal.  Chest:    Neurological:     Mental Status: She is alert.  Psychiatric:        Mood and Affect: Mood normal.     (all labs ordered are listed, but only abnormal results are displayed) Labs Reviewed - No data to display  EKG: None  Radiology: No results found.   Procedures   Medications Ordered in the ED  ibuprofen  (ADVIL ) tablet 800 mg (has no administration in time range)                                     Medical Decision Making  43 year old well-appearing female presenting for left breast pain.  Exam notable for nodular cystic like mass in the left breast.  No exam findings to suggest infection.  Advised that she follow-up in the breast clinic.  Recommended Tylenol  and ibuprofen  for pain.  Discussed return precautions.  Discharged.     Final diagnoses:  Breast pain, left    ED Discharge Orders     None          Lang Norleen POUR, PA-C 09/27/24 1721    Ruthe Cornet, DO 09/27/24 1737

## 2024-09-27 NOTE — ED Notes (Signed)
 Reviewed AVS/discharge instruction with patient. Time allotted for and all questions answered. Patient is agreeable for d/c and escorted to ed exit by staff.

## 2024-09-27 NOTE — Discharge Instructions (Signed)
 Evaluation today revealed concern that he may have some sort of mass in your left breast.  Please follow-up with the breast clinic.  Would recommend that you call them and schedule appointment soon as possible.  If you develop any chest pain, shortness of breath, any redness or swelling in your breast or any other concerning symptom please return to the ED for further evaluation.
